# Patient Record
Sex: Male | Born: 1950 | Race: White | Hispanic: No | Marital: Married | Smoking: Never smoker
Health system: Southern US, Community
[De-identification: ages and names within clinical notes are randomized; demographics above are authoritative.]

## PROBLEM LIST (undated history)

## (undated) DIAGNOSIS — E785 Hyperlipidemia, unspecified: Secondary | ICD-10-CM

## (undated) DIAGNOSIS — G4731 Primary central sleep apnea: Secondary | ICD-10-CM

## (undated) DIAGNOSIS — J45909 Unspecified asthma, uncomplicated: Secondary | ICD-10-CM

## (undated) DIAGNOSIS — G4730 Sleep apnea, unspecified: Secondary | ICD-10-CM

## (undated) DIAGNOSIS — R112 Nausea with vomiting, unspecified: Secondary | ICD-10-CM

## (undated) DIAGNOSIS — K429 Umbilical hernia without obstruction or gangrene: Secondary | ICD-10-CM

## (undated) DIAGNOSIS — T7840XA Allergy, unspecified, initial encounter: Secondary | ICD-10-CM

## (undated) DIAGNOSIS — D126 Benign neoplasm of colon, unspecified: Secondary | ICD-10-CM

## (undated) DIAGNOSIS — K439 Ventral hernia without obstruction or gangrene: Secondary | ICD-10-CM

## (undated) DIAGNOSIS — K648 Other hemorrhoids: Secondary | ICD-10-CM

## (undated) DIAGNOSIS — Z9889 Other specified postprocedural states: Secondary | ICD-10-CM

## (undated) HISTORY — DX: Umbilical hernia without obstruction or gangrene: K42.9

## (undated) HISTORY — DX: Other hemorrhoids: K64.8

## (undated) HISTORY — DX: Other specified postprocedural states: Z98.890

## (undated) HISTORY — DX: Hyperlipidemia, unspecified: E78.5

## (undated) HISTORY — DX: Sleep apnea, unspecified: G47.30

## (undated) HISTORY — DX: Nausea with vomiting, unspecified: R11.2

## (undated) HISTORY — PX: OTHER SURGICAL HISTORY: SHX169

## (undated) HISTORY — DX: Benign neoplasm of colon, unspecified: D12.6

## (undated) HISTORY — PX: TONSILLECTOMY: SUR1361

## (undated) HISTORY — DX: Other specified postprocedural states: R11.2

## (undated) HISTORY — PX: SHOULDER SURGERY: SHX246

## (undated) HISTORY — DX: Primary central sleep apnea: G47.31

## (undated) HISTORY — PX: HERNIA REPAIR: SHX51

## (undated) HISTORY — PX: CARPAL TUNNEL RELEASE: SHX101

## (undated) HISTORY — DX: Allergy, unspecified, initial encounter: T78.40XA

## (undated) HISTORY — PX: BACK SURGERY: SHX140

## (undated) HISTORY — DX: Ventral hernia without obstruction or gangrene: K43.9

## (undated) HISTORY — PX: COLONOSCOPY: SHX174

## (undated) HISTORY — DX: Unspecified asthma, uncomplicated: J45.909

## (undated) HISTORY — PX: UMBILICAL HERNIA REPAIR: SHX196

---
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
## 1983-04-29 HISTORY — PX: FOOT SURGERY: SHX648

## 2001-07-10 ENCOUNTER — Encounter: Payer: Self-pay | Admitting: Emergency Medicine

## 2001-07-10 ENCOUNTER — Emergency Department (HOSPITAL_COMMUNITY): Admission: EM | Admit: 2001-07-10 | Discharge: 2001-07-10 | Payer: Self-pay | Admitting: Emergency Medicine

## 2003-02-02 ENCOUNTER — Ambulatory Visit (HOSPITAL_COMMUNITY): Admission: RE | Admit: 2003-02-02 | Discharge: 2003-02-02 | Payer: Self-pay | Admitting: Gastroenterology

## 2007-09-10 ENCOUNTER — Ambulatory Visit (HOSPITAL_COMMUNITY): Admission: RE | Admit: 2007-09-10 | Discharge: 2007-09-10 | Payer: Self-pay | Admitting: General Surgery

## 2008-10-11 ENCOUNTER — Inpatient Hospital Stay (HOSPITAL_COMMUNITY): Admission: RE | Admit: 2008-10-11 | Discharge: 2008-10-13 | Payer: Self-pay | Admitting: Otolaryngology

## 2008-10-11 ENCOUNTER — Encounter (INDEPENDENT_AMBULATORY_CARE_PROVIDER_SITE_OTHER): Payer: Self-pay | Admitting: Otolaryngology

## 2008-10-11 HISTORY — PX: TONSILLECTOMY: SHX5217

## 2009-05-31 ENCOUNTER — Ambulatory Visit: Admission: RE | Admit: 2009-05-31 | Discharge: 2009-05-31 | Payer: Self-pay | Admitting: Otolaryngology

## 2009-05-31 DIAGNOSIS — G4731 Primary central sleep apnea: Secondary | ICD-10-CM

## 2009-05-31 HISTORY — DX: Primary central sleep apnea: G47.31

## 2010-08-05 LAB — CBC
HCT: 43.5 % (ref 39.0–52.0)
Hemoglobin: 14.8 g/dL (ref 13.0–17.0)
MCHC: 34.1 g/dL (ref 30.0–36.0)
MCV: 87.8 fL (ref 78.0–100.0)
Platelets: 198 10*3/uL (ref 150–400)
RBC: 4.95 MIL/uL (ref 4.22–5.81)
RDW: 12.2 % (ref 11.5–15.5)
WBC: 4.3 10*3/uL (ref 4.0–10.5)

## 2010-09-10 NOTE — H&P (Signed)
Charles [name], [name]                ACCOUNT NO.:  [id number]   MEDICAL RECORD NO.:  [id number]           PATIENT TYPE:  AMB   LOCATION:  DAY                           FACILITY:  [facility]   PHYSICIAN:  Dalia Heading, M.D.  DATE OF BIRTH:  24-May-1950   DATE OF ADMISSION:  DATE OF DISCHARGE:  LH                              HISTORY & PHYSICAL   CHIEF COMPLAINT:  Hematochezia.   HISTORY OF PRESENT ILLNESS:  The patient is a 60 year old white male who  is referred for endoscopic evaluation.  He needs a colonoscopy for  hematochezia.  He has noticed some blood on the toilet paper when he  wipes himself.  No abdominal pain, weight loss, nausea, vomiting,  diarrhea, constipation, or melena had been noted.  He last had a  colonoscopy over 5 years ago in [place].  No family history of colon  carcinoma is noted.   PAST MEDICAL HISTORY:  High cholesterol levels.   PAST SURGICAL HISTORY:  Back surgery, foot surgery.   CURRENT MEDICATIONS:  Zocor, ibuprofen, fish oil, Nexium, and Prilosec  in the past.   ALLERGIES:  PENICILLIN.   REVIEW OF SYSTEMS:  Noncontributory.   PHYSICAL EXAMINATION:  GENERAL:  The patient is a well-developed and  well-nourished white male in no acute distress.  LUNGS:  Clear to auscultation with equal breath sounds bilaterally.  HEART:  Examination reveals regular rate and rhythm without S3, S4, or  murmurs.  ABDOMEN:  Soft, nontender, and nondistended.  No hepatosplenomegaly or  masses are noted.  RECTAL:  Examination was deferred to the procedure.   IMPRESSION:  Hematochezia.   PLAN:  The patient is scheduled for a colonoscopy on Aug 31, 2007.  The  risks and benefits of the procedure including bleeding and perforation  were fully explained to the patient, gave informed consent.      Dalia Heading, M.D.     MAJ/MEDQ  D:  09/02/2007  T:  09/03/2007  Job:  [id number]   cc:   Corrie Mckusick, M.D.  Fax: [phone]

## 2010-09-10 NOTE — Op Note (Signed)
Charles [name], Charles [name]                ACCOUNT NO.:  [id number]   MEDICAL RECORD NO.:  [id number]          PATIENT TYPE:  INP   LOCATION:  2602                         FACILITY:  [facility]   PHYSICIAN:  Jefry H. Pollyann Kennedy, MD     DATE OF BIRTH:  1950-06-11   DATE OF PROCEDURE:  10/11/2008  DATE OF DISCHARGE:                               OPERATIVE REPORT   PREOPERATIVE DIAGNOSES:  1. Obstructive sleep apnea syndrome, severe, respiratory distress      index 44.  2. 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  3. Bilateral inferior turbinate hyperplasia.  4. Tonsil hyperplasia.   POSTOPERATIVE DIAGNOSES:  1. Obstructive sleep apnea syndrome, severe, respiratory distress      index 44.  2. 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  3. Bilateral inferior turbinate hyperplasia.  4. Tonsil hyperplasia.   PROCEDURE:  1. Tonsillectomy.  2. Uvulopalatopharyngoplasty.  3. Revision nasal septoplasty.  4. Bilateral submucous resection, inferior turbinates.   SURGEON:  Jefry H. Pollyann Kennedy, MD   ANESTHESIA:  General endotracheal anesthesia was used.   COMPLICATIONS:  None.   BLOOD LOSS:  Minimal.   FINDINGS:  Long and thickened tonsils with a thickened, elongated, and  redundant soft palate and uvula tissue.  The right anterior nasal septal  spur was created by the anterior/inferior aspect of the quadrangular  cartilage.  Bilateral inferior turbinate hyperplasia.   REFERRING PHYSICIAN:  Corrie Mckusick, MD   HISTORY:  This is a 60 year old who was found to have moderately severe  obstructive sleep apnea syndrome and was unable to tolerate CPAP long  enough to undergo CPAP trial.  Risks, benefits, alternatives, and  complications of the procedure are explained to the patient and he seems  to understand and agreed to surgery.   PROCEDURE:  The patient was taken to the operating room and placed on  the operating table in the supine position.  Following induction of  general endotracheal anesthesia, the table was turned and the patient  was draped in a standard fashion.   1. Tonsillectomy:  A Crowe-Davis mouthgag was inserted into the oral      cavity, used to retract the tongue, and mandible attached to Mayo      stand.  The tonsils were dissected out using electrocautery      dissection, carefully dissecting the avascular plane between the      capsule and constrictor muscles.  Suction cautery was used in      multiple areas for completion of hemostasis.  2. Uvulopalatopharyngoplasty:  Initial marks were made in the anterior      palate mucosa with electrocautery allowing sufficient residual      palate to prevent postoperative VPI.  The electrocautery was used      to incise the anterior mucosa through the muscular layer and then      through the posterior mucosa slightly longer than the anterior      mucosa to allow for bringing the posterior mucosa forward during      the closure.  When the entire  pharyngeal specimen was removed, a      suction cautery was used for completion of hemostasis.  The pharynx      was irrigated with saline and re-inspected.  The corners were      tacked up initially with 3-0 Vicryl suture and then the soft palate      closure line was performed using a running 3-0 Vicryl.  The      tonsillar defect was then reapproximated with interrupted 3-0      Vicryl sutures.  Additional irrigation was used and then attention      was diverted towards the nasal part of the procedure.  3. Nasal septoplasty:  The nasal septum was injected with Xylocaine      with epinephrine.  Afrin spray had been used preoperatively.  Afrin      pledgets were placed bilaterally.  On the right side just anterior      and superior to the spur, a 2-cm mucosal incision was created.  The      Cottle elevator was used to elevate the mucosa off the      cartilaginous spur.  The Cottle elevator was then used to remove      fragments of the cartilage which  were causing the spurring.  When      there was adequate cartilage removed, there was still sufficient      dorsal support present.  The septum was quilted with 4-0 plain gut      and a Mellody Dance needle.  4. Bilateral submucous resection of inferior turbinates:  The leading      edge of the inferior turbinates was incised in a vertical fashion      following the injection of Xylocaine with epi.  Cottle elevator was      used to elevate mucosa off bone.  The bony turbinates were      elongated, but very thin and reasonably fractured into multiple      pieces.  The turbinate remnant was outfractured.  Nasal cavity and      pharynx were suctioned of blood and secretions.  The nasal cavities      were packed with rolled up Telfa coated with bacitracin.      Orogastric was used to aspirate contents of the stomach.  The      patient was then awakened, extubated, and transferred to recovery      in stable condition.      Jefry H. Pollyann Kennedy, MD  Electronically Signed     JHR/MEDQ  D:  10/11/2008  T:  10/12/2008  Job:  [id number]

## 2010-09-10 NOTE — H&P (Signed)
NAMEMACARIO, SHEAR                ACCOUNT NO.:  [id number]   MEDICAL RECORD NO.:  [id number]           PATIENT TYPE:  AMB   LOCATION:  DAY                           FACILITY:  [facility]   PHYSICIAN:  Dalia Heading, M.D.  DATE OF BIRTH:  Aug 15, 1950   DATE OF ADMISSION:  DATE OF DISCHARGE:  LH                              HISTORY & PHYSICAL   CHIEF COMPLAINT:  Hematochezia.   HISTORY OF PRESENT ILLNESS:  The patient is a 60 year old white male who  is referred for endoscopic evaluation.  He needs a colonoscopy for  hematochezia.  He has noticed some blood on the toilet paper when he  wipes himself.  No abdominal pain, weight loss, nausea, vomiting,  diarrhea, constipation, or melena had been noted.  He last had a  colonoscopy over 5 years ago in [place].  No family history of colon  carcinoma is noted.   PAST MEDICAL HISTORY:  High cholesterol levels.   PAST SURGICAL HISTORY:  Back surgery, foot surgery.   CURRENT MEDICATIONS:  Zocor, ibuprofen, fish oil, Nexium, and Prilosec  in the past.   ALLERGIES:  PENICILLIN.   REVIEW OF SYSTEMS:  Noncontributory.   PHYSICAL EXAMINATION:  GENERAL:  The patient is a well-developed and  well-nourished white male in no acute distress.  LUNGS:  Clear to auscultation with equal breath sounds bilaterally.  HEART:  Examination reveals regular rate and rhythm without S3, S4, or  murmurs.  ABDOMEN:  Soft, nontender, and nondistended.  No hepatosplenomegaly or  masses are noted.  RECTAL:  Examination was deferred to the procedure.   IMPRESSION:  Hematochezia.   PLAN:  The patient is scheduled for a colonoscopy on Aug 31, 2007.  The  risks and benefits of the procedure including bleeding and perforation  were fully explained to the patient, gave informed consent.      Dalia Heading, M.D.  Electronically Signed     MAJ/MEDQ  D:  09/02/2007  T:  09/03/2007  Job:  [id number]   cc:   Corrie Mckusick, M.D.  Fax: [phone]

## 2010-09-13 NOTE — Op Note (Signed)
   NAME:  FLEET, HIGHAM                          ACCOUNT NO.:  [id number]   MEDICAL RECORD NO.:  [id number]                   PATIENT TYPE:  AMB   LOCATION:  ENDO                                 FACILITY:  [facility]   PHYSICIAN:  Danise Edge, M.D.                DATE OF BIRTH:  June 20, 1950   DATE OF PROCEDURE:  02/02/2003  DATE OF DISCHARGE:                                 OPERATIVE REPORT   PROCEDURE PERFORMED:  Screening colonoscopy.   ENDOSCOPIST:  Charolett Bumpers, M.D.   INDICATIONS FOR PROCEDURE:  The patient is a 60 year old male born [birth year].  Mr. Degrace is scheduled to undergo his first screening colonoscopy  with polypectomy to prevent colon cancer.  There are no first degree family  members with colon cancer.  There are second degree family members with  colon cancer.   PREMEDICATION:  Versed 10 mg, Demerol 75 mg.   DESCRIPTION OF PROCEDURE:  After obtaining informed consent, Mr. Cosman was  placed in the left lateral decubitus position.  I administered intravenous  Demerol and intravenous Versed to achieve conscious sedation for the  procedure.  The patient's blood pressure, oxygen saturations and cardiac  rhythm were monitored throughout the procedure and documented in the medical  record.   Anal inspection was normal.  Digital rectal exam was normal.  The prostate  was small and nonnodular.  The pediatric Olympus adjustable colonoscope was  introduced into the rectum and advanced to the cecum.  A normal-appearing  ileocecal valve was intubated and the distal ileum inspected.  Colonic  preparation for the exam today was excellent.   Rectum:  Normal.   Sigmoid colon and descending colon:  Normal.   Splenic flexure:  Normal.   Transverse colon:  Normal.   Hepatic flexure:  Normal.   Ascending colon:  Normal.   Cecum and ileocecal valve:  Normal.   Distal ileum:  Normal.    ASSESSMENT:  Normal screening proctocolonoscopy to the cecum.   RECOMMENDATIONS:  Repeat optical colonoscopy or virtual colonoscopy in five  to 10 years.                                               Danise Edge, M.D.    MJ/MEDQ  D:  02/02/2003  T:  02/02/2003  Job:  [id number]   cc:   Ike Bene, M.D.  [address]  [place]  [place] [address]  Fax: [phone]

## 2011-05-19 ENCOUNTER — Other Ambulatory Visit (HOSPITAL_COMMUNITY): Payer: Self-pay | Admitting: Family Medicine

## 2011-05-19 DIAGNOSIS — M545 Low back pain: Secondary | ICD-10-CM

## 2011-05-22 ENCOUNTER — Ambulatory Visit (HOSPITAL_COMMUNITY)
Admission: RE | Admit: 2011-05-22 | Discharge: 2011-05-22 | Disposition: A | Discharge status: Home / Self Care | Payer: BC Managed Care – PPO | Source: Ambulatory Visit | Attending: Family Medicine | Admitting: Family Medicine

## 2011-05-22 DIAGNOSIS — IMO0002 Reserved for concepts with insufficient information to code with codable children: Secondary | ICD-10-CM | POA: Insufficient documentation

## 2011-05-22 DIAGNOSIS — M545 Low back pain, unspecified: Secondary | ICD-10-CM | POA: Insufficient documentation

## 2011-05-22 DIAGNOSIS — M25559 Pain in unspecified hip: Secondary | ICD-10-CM | POA: Insufficient documentation

## 2011-05-22 DIAGNOSIS — M79609 Pain in unspecified limb: Secondary | ICD-10-CM | POA: Insufficient documentation

## 2011-05-22 DIAGNOSIS — M5124 Other intervertebral disc displacement, thoracic region: Secondary | ICD-10-CM | POA: Insufficient documentation

## 2011-09-08 HISTORY — PX: DOPPLER ECHOCARDIOGRAPHY: SHX263

## 2012-11-30 ENCOUNTER — Other Ambulatory Visit: Payer: Self-pay | Admitting: Cardiovascular Disease

## 2012-11-30 DIAGNOSIS — K429 Umbilical hernia without obstruction or gangrene: Secondary | ICD-10-CM

## 2012-11-30 DIAGNOSIS — K439 Ventral hernia without obstruction or gangrene: Secondary | ICD-10-CM

## 2012-11-30 HISTORY — DX: Ventral hernia without obstruction or gangrene: K43.9

## 2012-11-30 HISTORY — DX: Umbilical hernia without obstruction or gangrene: K42.9

## 2012-11-30 LAB — COMPREHENSIVE METABOLIC PANEL
ALT: 25 U/L (ref 0–53)
AST: 28 U/L (ref 0–37)
Albumin: 4.4 g/dL (ref 3.5–5.2)
Alkaline Phosphatase: 30 U/L — ABNORMAL LOW (ref 39–117)
BUN: 18 mg/dL (ref 6–23)
CO2: 27 mEq/L (ref 19–32)
Calcium: 9.4 mg/dL (ref 8.4–10.5)
Chloride: 103 mEq/L (ref 96–112)
Creat: 1.01 mg/dL (ref 0.50–1.35)
Glucose, Bld: 91 mg/dL (ref 70–99)
Potassium: 4.4 mEq/L (ref 3.5–5.3)
Sodium: 138 mEq/L (ref 135–145)
Total Bilirubin: 0.8 mg/dL (ref 0.3–1.2)
Total Protein: 6.8 g/dL (ref 6.0–8.3)

## 2012-11-30 LAB — LIPID PANEL
HDL: 36 mg/dL — ABNORMAL LOW (ref >39)
LDL Cholesterol: 72 mg/dL (ref 0–99)
Triglycerides: 192 mg/dL — ABNORMAL HIGH (ref <150)
VLDL: 38 mg/dL (ref 0–40)

## 2012-11-30 LAB — CBC WITH DIFFERENTIAL/PLATELET
Basophils Absolute: 0 10*3/uL (ref 0.0–0.1)
Basophils Relative: 0 % (ref 0–1)
Eosinophils Absolute: 0.1 10*3/uL (ref 0.0–0.7)
Eosinophils Relative: 2 % (ref 0–5)
HCT: 44.6 % (ref 39.0–52.0)
Hemoglobin: 15.4 g/dL (ref 13.0–17.0)
Lymphocytes Relative: 30 % (ref 12–46)
Lymphs Abs: 1.5 10*3/uL (ref 0.7–4.0)
MCH: 29.2 pg (ref 26.0–34.0)
MCHC: 34.5 g/dL (ref 30.0–36.0)
MCV: 84.6 fL (ref 78.0–100.0)
Monocytes Absolute: 0.4 10*3/uL (ref 0.1–1.0)
Monocytes Relative: 9 % (ref 3–12)
Neutro Abs: 3.1 10*3/uL (ref 1.7–7.7)
Neutrophils Relative %: 59 % (ref 43–77)
Platelets: 225 10*3/uL (ref 150–400)
RBC: 5.27 MIL/uL (ref 4.22–5.81)
RDW: 13 % (ref 11.5–15.5)
WBC: 5.2 10*3/uL (ref 4.0–10.5)

## 2012-12-02 ENCOUNTER — Encounter: Payer: Self-pay | Admitting: Cardiovascular Disease

## 2013-06-14 ENCOUNTER — Ambulatory Visit (HOSPITAL_BASED_OUTPATIENT_CLINIC_OR_DEPARTMENT_OTHER): Admit: 2013-06-14 | Payer: Self-pay | Admitting: Orthopaedic Surgery

## 2013-06-14 ENCOUNTER — Encounter (HOSPITAL_BASED_OUTPATIENT_CLINIC_OR_DEPARTMENT_OTHER): Payer: Self-pay

## 2013-06-14 SURGERY — CARPAL TUNNEL RELEASE
Anesthesia: Choice | Site: Wrist | Laterality: Right

## 2013-06-30 ENCOUNTER — Ambulatory Visit: Payer: BC Managed Care – PPO | Admitting: Cardiology

## 2013-07-19 ENCOUNTER — Encounter: Payer: Self-pay | Admitting: *Deleted

## 2013-07-21 ENCOUNTER — Ambulatory Visit (INDEPENDENT_AMBULATORY_CARE_PROVIDER_SITE_OTHER): Payer: BC Managed Care – PPO | Admitting: Cardiology

## 2013-07-21 ENCOUNTER — Encounter: Payer: Self-pay | Admitting: Cardiology

## 2013-07-21 VITALS — BP 130/68 | HR 71 | Ht 68.0 in | Wt 215.1 lb

## 2013-07-21 DIAGNOSIS — Z79899 Other long term (current) drug therapy: Secondary | ICD-10-CM

## 2013-07-21 DIAGNOSIS — I4949 Other premature depolarization: Secondary | ICD-10-CM

## 2013-07-21 DIAGNOSIS — E785 Hyperlipidemia, unspecified: Secondary | ICD-10-CM

## 2013-07-21 DIAGNOSIS — I493 Ventricular premature depolarization: Secondary | ICD-10-CM | POA: Insufficient documentation

## 2013-07-21 LAB — COMPREHENSIVE METABOLIC PANEL
ALBUMIN: 4.6 g/dL (ref 3.5–5.2)
ALT: 22 U/L (ref 0–53)
AST: 23 U/L (ref 0–37)
Alkaline Phosphatase: 30 U/L — ABNORMAL LOW (ref 39–117)
BUN: 19 mg/dL (ref 6–23)
CALCIUM: 9.8 mg/dL (ref 8.4–10.5)
CHLORIDE: 104 meq/L (ref 96–112)
CO2: 24 mEq/L (ref 19–32)
Creat: 1.06 mg/dL (ref 0.50–1.35)
Glucose, Bld: 86 mg/dL (ref 70–99)
POTASSIUM: 4.6 meq/L (ref 3.5–5.3)
Sodium: 138 mEq/L (ref 135–145)
Total Bilirubin: 0.7 mg/dL (ref 0.2–1.2)
Total Protein: 7.2 g/dL (ref 6.0–8.3)

## 2013-07-21 NOTE — Patient Instructions (Signed)
Your physician has requested that you have en exercise stress myoview. For further information please visit [email or web address]. Please follow instruction sheet, as given.  Labs NMR WITH LIPIDS,CMP  Your physician wants you to follow-up in 6 MONTHS  Dr Ellyn Hack. You will receive a reminder letter in the mail two months in advance. If you don't receive a letter, please call our office to schedule the follow-up appointment.

## 2013-07-21 NOTE — Progress Notes (Signed)
PATIENT: Charles [name] MRN: [id number] DOB: 1950/06/25 PCP: Purvis Kilts, MD  Clinic Note: Chief Complaint  Patient presents with  . 6 MONTH VISIT    NO CHEST PAIN VERY SELDOM  SOB , NO EDEMA    HPI: Charles [name] is a 63 y.o. male with a PMH below who presents today for establishing new cardiology care after the return of Dr. Rollene Fare. He has been followed for preventative cardiology with significant family history including his father who died of an MI after troubles for several years. He was 67. His brother at 85 also had coronary disease status post CABG. His father and brother are now dead.  Interval History: He presents today doing okay. No significant chest tightness or pressure. He has noticed increasing frequency of irregular heartbeats. They can come and go regardless of what he is doing. One thing he is also noticing that is generally more fatigued in the past by the afternoon he is just pretty much worn out. He also has been having significant pressure headaches. He denies any heart failure symptoms of PND, orthopnea or edema. Denies any claudication. No melena, hematochezia or hematuria. No CP/near syncope. No TIA or amaurosis fugax symptoms.  Past Medical History  Diagnosis Date  . Hx of sleep apnea     had used C-PAP,HE HAD ent  surgery 2008 by Dr Constance Holster and no longer requires C-PAP  . Ventral hernia 11/30/2012    ASYMPTOMATIC  . Umbilical hernia 05/30/1939    ASYMPTOMATIC WITH NO GI  SYMPTOMS  . Hyperlipidemia   . Sleep apnea, central 05/31/2009    NOCTURNAL POLYSOMNOGRAM -(DR Constance Holster, ENT) MILD SLEEP APNEA SYNDROME ,MILD HYPOVENTILATION ; ABN SLEEP ARCHITECTURE WITH POOR SLEEP EFFICIENCY AND REDUCED SLOW WAVE SLEEP RESULTS AN INCREASESD STAGE 2 SLEEP. ;; REC 2 L QHS O2 - No longer on CPAP     Prior Cardiac Evaluation and Past Surgical History: Past Surgical History  Procedure Laterality Date  . Doppler echocardiography  09/08/2011    EF =>74% ,LV SYSTOLIC FUNCTION  NORMAL   . Treadmill exercise stress test  01/01/2012    NORMOTENSIVE REPONSE TO EXERCISE; GOOD EXERCISE TOLERANCE;NO ECG evidence of exercise-induced ischemia; rare PVCs  . Nm myocar single w/spect  07/30/2007    EF 08% ,lv systolic function  is normal   . Lumbar laminectomy  1970  . Nasal septum surgery  1970  . Foot surgery Right 1985  . Tonsillectomy  10/11/2008    DR Constance Holster-- with uvulopalatophrayngoplasty,revision nasal septolasty    Allergies  Allergen Reactions  . Penicillins     Current Outpatient Prescriptions  Medication Sig Dispense Refill  . Ascorbic Acid (VITAMIN C PO) Take 1,000 mg by mouth.      Marland Kitchen aspirin EC 81 MG tablet Take 81 mg by mouth daily.      . Calcium Carbonate-Vit D-Min (CALCIUM 1200 PO) Take by mouth.      . Cholecalciferol (VITAMIN D3) 400 UNITS CAPS Take by mouth.      . Coenzyme Q10 (CO Q 10 PO) Take 30 mg by mouth daily.      . Cyanocobalamin (VITAMIN B 12 PO) Take 1,000 mg by mouth daily.      . fenofibrate 160 MG tablet Take 160 mg by mouth daily.      . folic acid (FOLVITE) 1 MG tablet Take 1 mg by mouth daily.      Marland Kitchen GARLIC PO Take by mouth.      . Glucosamine  HCl (GLUCOSAMINE PO) Take by mouth.      Marland Kitchen ibuprofen (ADVIL,MOTRIN) 200 MG tablet Take 200 mg by mouth every 6 (six) hours as needed.      . Magnesium 200 MG TABS Take by mouth.      . Multiple Vitamin (MULTIVITAMIN) tablet Take 1 tablet by mouth daily.      . Omega-3 Fatty Acids (FISH OIL PO) Take by mouth. Take 2 caps in morning and 1 caps in evening      . simvastatin (ZOCOR) 20 MG tablet Take 20 mg by mouth daily.      . Zinc 50 MG TABS Take by mouth daily.       No current facility-administered medications for this visit.    History   Social History Narrative   Married father of 2 with 1 grandchild. 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   He does not do any regular walking.  He is to use his home workout machine, but now is noted that he really hasn't had the same amount of time at the end of the day the use to. Using working from 6 AM to 7 PM.    Family History: Alzheimer's disease in his father; Diabetes in his brother; Heart disease in his brother - died age 97 from MI, status post CABG; Heart disease (age of onset: 5) in his father; Hyperlipidemia in his mother.  ROS: A comprehensive Review of Systems - Negative except Symptoms noted in of present illness.  PHYSICAL EXAM BP 130/68  Pulse 71  Ht 5\' 8"  (1.727 m)  Wt 215 lb 1.6 oz (97.569 kg)  BMI 32.71 kg/m2 General appearance: alert, cooperative, appears stated age, no distress and mildly obese Neck: no adenopathy, no carotid bruit, no JVD and supple, symmetrical, trachea midline Lungs: clear to auscultation bilaterally, normal percussion bilaterally and Nonlabored, good air movement Heart: RRR, normal S1 and S2. Soft 1/6 SEM at LUSB. No other significant M./R./G.; Significant ectopy Abdomen: soft, non-tender; bowel sounds normal; no masses,  no organomegaly Extremities: extremities normal, atraumatic, no cyanosis or edema Pulses: 2+ and symmetric Neurologic: Alert and oriented X 3, normal strength and tone. Normal symmetric reflexes. Normal coordination and gait   Adult ECG Report  Rate: 77 ;  Rhythm: normal sinus rhythm and premature ventricular contractions (PVC)  QRS Axis: -1 ;  PR Interval: 144 ;  QRS Duration: 88 ; QTc: 434;   Voltages: Normal  Conduction Disturbances: none  Other Abnormalities: none   Narrative Interpretation: Sinus rhythm with frequent PVCs in almost a trigeminy pattern  Recent Labs: 07/22/2011: S. right 164 and LDL 66. (In August 2014: Toprol from 146, HDL 36 and LDL 72.TG was 192.  ASSESSMENT / PLAN: Overall relatively stable for symptom standpoint however he does have significant amount of PVCs on  EKG, also noted on exam.  He is on aspirin for preventative nature. For his lipids he is on simvastatin as well as CoQ10 and omega-3 fish 00. This is in conjunction with the CoQ10. Although symptoms seem to be getting better, I am concerned that his PVCs are still there at present.    Frequent PVCs Monomorphic PVCs on EKG are somewhat concerning potential ischemia. At this point I am inclined to evaluate  his change in symptoms with an exercise Myoview. I am not sure he'll be able to do the medics as necessary for a straight a treadmill exercise test. My main concern is that he may not be able to complete exercises treadmill test based on his concerns.  Dyslipidemia, goal LDL below 100 - on statin & fibrate His lipids are well-controlled pleased with a goal. He is taking multiple medications. - LAD is being followed by PCP   2 Orders Placed This Encounter  Procedures  . NMR Lipoprofile with Lipids  . Comprehensive metabolic panel  . Myocardial Perfusion Imaging    Standing Status: Future     Number of Occurrences:      Standing Expiration Date: 07/21/2014    Order Specific Question:  Where should this test be performed    Answer:  [facility]    Order Specific Question:  Type of stress    Answer:  Exercise    Order Specific Question:  Patient weight in lbs    Answer:  215  . EKG 12-Lead   Meds ordered this encounter  Medications  . ibuprofen (ADVIL,MOTRIN) 200 MG tablet    Sig: Take 200 mg by mouth every 6 (six) hours as needed.  . Magnesium 200 MG TABS    Sig: Take by mouth.  . Zinc 50 MG TABS    Sig: Take by mouth daily.  . Cyanocobalamin (VITAMIN B 12 PO)    Sig: Take 1,000 mg by mouth daily.  . Coenzyme Q10 (CO Q 10 PO)    Sig: Take 30 mg by mouth daily.    Followup: 6 months  Solace Manwarren W. Ellyn Hack, M.D., M.S. Interventional Cardiolgy [facility]

## 2013-07-22 LAB — NMR LIPOPROFILE WITH LIPIDS
CHOLESTEROL, TOTAL: 140 mg/dL (ref <200)
HDL PARTICLE NUMBER: 36 umol/L (ref >=30.5)
HDL Size: 8.2 nm — ABNORMAL LOW (ref >=9.2)
HDL-C: 41 mg/dL (ref >=40)
LDL (calc): 66 mg/dL (ref <100)
LDL Particle Number: 925 nmol/L (ref <1000)
LDL Size: 20.5 nm — ABNORMAL LOW (ref >20.5)
LP-IR Score: 59 — ABNORMAL HIGH (ref <=45)
Large HDL-P: <1.3 umol/L — ABNORMAL LOW (ref >=4.8)
Large VLDL-P: 1.6 nmol/L (ref <=2.7)
Small LDL Particle Number: 409 nmol/L (ref <=527)
TRIGLYCERIDES: 164 mg/dL — AB (ref <150)
VLDL SIZE: 43.2 nm (ref <=46.6)

## 2013-07-23 ENCOUNTER — Encounter: Payer: Self-pay | Admitting: Cardiology

## 2013-07-23 NOTE — Assessment & Plan Note (Signed)
Monomorphic PVCs on EKG are somewhat concerning potential ischemia. At this point I am inclined to evaluate his change in symptoms with an exercise Myoview. I am not sure he'll be able to do the medics as necessary for a straight a treadmill exercise test. My main concern is that he may not be able to complete exercises treadmill test based on his concerns.

## 2013-07-23 NOTE — Assessment & Plan Note (Signed)
His lipids are well-controlled pleased with a goal. He is taking multiple medications. - LAD is being followed by PCP

## 2013-07-28 ENCOUNTER — Telehealth (HOSPITAL_COMMUNITY): Payer: Self-pay

## 2013-07-29 ENCOUNTER — Telehealth (HOSPITAL_COMMUNITY): Payer: Self-pay

## 2013-08-03 ENCOUNTER — Encounter (HOSPITAL_COMMUNITY): Payer: BC Managed Care – PPO

## 2013-08-04 ENCOUNTER — Telehealth (HOSPITAL_COMMUNITY): Payer: Self-pay | Admitting: *Deleted

## 2013-08-11 ENCOUNTER — Ambulatory Visit (HOSPITAL_COMMUNITY)
Admission: RE | Admit: 2013-08-11 | Discharge: 2013-08-11 | Disposition: A | Discharge status: Home / Self Care | Payer: BC Managed Care – PPO | Source: Ambulatory Visit | Attending: Cardiology | Admitting: Cardiology

## 2013-08-11 DIAGNOSIS — R0609 Other forms of dyspnea: Secondary | ICD-10-CM | POA: Insufficient documentation

## 2013-08-11 DIAGNOSIS — I4949 Other premature depolarization: Secondary | ICD-10-CM

## 2013-08-11 DIAGNOSIS — R002 Palpitations: Secondary | ICD-10-CM | POA: Insufficient documentation

## 2013-08-11 DIAGNOSIS — R0989 Other specified symptoms and signs involving the circulatory and respiratory systems: Secondary | ICD-10-CM | POA: Insufficient documentation

## 2013-08-11 DIAGNOSIS — R42 Dizziness and giddiness: Secondary | ICD-10-CM | POA: Insufficient documentation

## 2013-08-11 DIAGNOSIS — R5383 Other fatigue: Secondary | ICD-10-CM

## 2013-08-11 DIAGNOSIS — R5381 Other malaise: Secondary | ICD-10-CM | POA: Insufficient documentation

## 2013-08-11 DIAGNOSIS — I493 Ventricular premature depolarization: Secondary | ICD-10-CM

## 2013-08-11 MED ORDER — TECHNETIUM TC 99M SESTAMIBI GENERIC - CARDIOLITE
30.6000 | Freq: Once | INTRAVENOUS | Status: AC | PRN
  Administered 2013-08-11: 31 via INTRAVENOUS

## 2013-08-11 MED ORDER — TECHNETIUM TC 99M SESTAMIBI GENERIC - CARDIOLITE
10.5000 | Freq: Once | INTRAVENOUS | Status: AC | PRN
  Administered 2013-08-11: 11 via INTRAVENOUS

## 2013-08-11 NOTE — Procedures (Addendum)
[facility] [address] [place] [address] [phone]  Cardiology Nuclear Med Study  Charles [name] is a 63 y.o. male     MRN : [id number]     DOB: 29-Aug-1950  Procedure Date: 08/11/2013  Nuclear Med Background Indication for Stress Test:  Evaluation for Ischemia History:  No prior respiratory or cardiac history reported;No prior NUC study for comparrison;ECHO on 08/2011-EF=55% Cardiac Risk Factors: Family History - CAD, Lipids and Obesity  Symptoms:  Dizziness, DOE, Fatigue, Light-Headedness and Palpitations   Nuclear Pre-Procedure Caffeine/Decaff Intake:  7:00pm NPO After: 5:00am   IV Site: R Forearm  IV 0.9% NS with Angio Cath:  22g  Chest Size (in):  44"  IV Started by: Rolene Course, RN  Height: 5\' 8"  (1.727 m)  Cup Size: n/a  BMI:  Body mass index is 32.7 kg/(m^2). Weight:  215 lb (97.523 kg)   Tech Comments:  n/a    Nuclear Med Study 1 or 2 day study: 1 day  Stress Test Type:  Stress  Order Authorizing Provider:  Glenetta Hew, MD   Resting Radionuclide: Technetium 92m Sestamibi  Resting Radionuclide Dose: 10.5 mCi   Stress Radionuclide:  Technetium 36m Sestamibi  Stress Radionuclide Dose: 30.6 mCi           Stress Protocol Rest HR: 77 Stress HR: 148  Rest BP: 130/74 Stress BP: 166/74  Exercise Time (min): 11:01 METS: 12.10          Dose of Adenosine (mg):  n/a Dose of Lexiscan: n/a mg  Dose of Atropine (mg): n/a Dose of Dobutamine: n/a mcg/kg/min (at max HR)  Stress Test Technologist: Mellody Memos, CCT Nuclear Technologist: Imagene Riches, CNMT   Rest Procedure:  Myocardial perfusion imaging was performed at rest 45 minutes following the intravenous administration of Technetium 66m Sestamibi. Stress Procedure:  The patient performed treadmill exercise using a Bruce  Protocol for 11 minutes and 1 second. The patient stopped due to shortness of breath and leg fatigue. Patient denied any chest pain.   There were no significant ST-T wave changes.  Technetium 71m Sestamibi was injected IV at peak exercise and myocardial perfusion imaging was performed after a brief delay.  Transient Ischemic Dilatation (Normal <1.22):  0.86 Lung/Heart Ratio (Normal <0.45):  0.41 QGS EDV:  67 ml QGS ESV:  25 ml LV Ejection Fraction: 62%      Rest ECG: NSR - Normal EKG  Stress ECG: No significant change from baseline ECG  QPS Raw Data Images:  Normal; no motion artifact; normal heart/lung ratio. Stress Images:  Normal homogeneous uptake in all areas of the myocardium. Rest Images:  Normal homogeneous uptake in all areas of the myocardium. Subtraction (SDS):  Normal  Impression Exercise Capacity:  Excellent exercise capacity. BP Response:  Normal blood pressure response. Clinical Symptoms:  No significant symptoms noted. ECG Impression:  No significant ST segment change suggestive of ischemia. Comparison with Prior Nuclear Study: No previous nuclear study performed  Overall Impression:  Normal stress nuclear study.  LV Wall Motion:  NL LV Function, EF 62%; NL Wall Motion   Troy Sine, MD  08/11/2013 4:51 PM

## 2013-08-14 NOTE — Progress Notes (Signed)
Quick Note:  Stress Test looked good!! No sign of significant Heart Artery Disease. Pump function is normal. Excellent result -- great effort.  Good news!!.  Leonie Man, MD  ______

## 2013-08-16 ENCOUNTER — Telehealth: Payer: Self-pay | Admitting: *Deleted

## 2013-08-16 NOTE — Telephone Encounter (Signed)
Spoke to patient. Stress test result given . Verbalized understanding

## 2013-08-16 NOTE — Telephone Encounter (Signed)
Message copied by Raiford Simmonds on Tue Aug 16, 2013  9:32 AM ------      Message from: Leonie Man      Created: Sun Aug 14, 2013  3:51 PM       Stress Test looked good!! No sign of significant Heart Artery Disease.  Pump function is normal.      Excellent result -- great effort.            Good news!!.            Leonie Man, MD       ------

## 2013-10-26 NOTE — Telephone Encounter (Signed)
Encounter complete. 

## 2013-11-09 NOTE — Telephone Encounter (Signed)
Encounter complete. 

## 2014-06-16 ENCOUNTER — Other Ambulatory Visit: Payer: Self-pay | Admitting: Family Medicine

## 2014-06-16 DIAGNOSIS — M25511 Pain in right shoulder: Secondary | ICD-10-CM

## 2014-06-16 DIAGNOSIS — M25512 Pain in left shoulder: Principal | ICD-10-CM

## 2014-07-04 ENCOUNTER — Ambulatory Visit
Admission: RE | Admit: 2014-07-04 | Discharge: 2014-07-04 | Disposition: A | Discharge status: Home / Self Care | Payer: BLUE CROSS/BLUE SHIELD | Source: Ambulatory Visit | Attending: Family Medicine | Admitting: Family Medicine

## 2014-07-04 DIAGNOSIS — M25511 Pain in right shoulder: Secondary | ICD-10-CM

## 2014-07-04 DIAGNOSIS — M25512 Pain in left shoulder: Principal | ICD-10-CM

## 2014-08-14 ENCOUNTER — Encounter: Payer: Self-pay | Admitting: Cardiology

## 2014-08-14 ENCOUNTER — Ambulatory Visit (INDEPENDENT_AMBULATORY_CARE_PROVIDER_SITE_OTHER): Payer: BLUE CROSS/BLUE SHIELD | Admitting: Cardiology

## 2014-08-14 VITALS — BP 122/74 | HR 86 | Ht 69.0 in | Wt 213.6 lb

## 2014-08-14 DIAGNOSIS — Z79899 Other long term (current) drug therapy: Secondary | ICD-10-CM

## 2014-08-14 DIAGNOSIS — I493 Ventricular premature depolarization: Secondary | ICD-10-CM | POA: Diagnosis not present

## 2014-08-14 DIAGNOSIS — E669 Obesity, unspecified: Secondary | ICD-10-CM | POA: Diagnosis not present

## 2014-08-14 DIAGNOSIS — E785 Hyperlipidemia, unspecified: Secondary | ICD-10-CM | POA: Diagnosis not present

## 2014-08-14 LAB — LIPID PANEL
Cholesterol: 174 mg/dL (ref 0–200)
HDL: 39 mg/dL — AB (ref >=40)
LDL CALC: 93 mg/dL (ref 0–99)
Total CHOL/HDL Ratio: 4.5 Ratio
Triglycerides: 209 mg/dL — ABNORMAL HIGH (ref <150)
VLDL: 42 mg/dL — AB (ref 0–40)

## 2014-08-14 LAB — COMPREHENSIVE METABOLIC PANEL
ALK PHOS: 52 U/L (ref 39–117)
ALT: 21 U/L (ref 0–53)
AST: 23 U/L (ref 0–37)
Albumin: 4.4 g/dL (ref 3.5–5.2)
BILIRUBIN TOTAL: 0.6 mg/dL (ref 0.2–1.2)
BUN: 20 mg/dL (ref 6–23)
CO2: 27 meq/L (ref 19–32)
CREATININE: 0.95 mg/dL (ref 0.50–1.35)
Calcium: 9.8 mg/dL (ref 8.4–10.5)
Chloride: 103 mEq/L (ref 96–112)
GLUCOSE: 79 mg/dL (ref 70–99)
Potassium: 4.7 mEq/L (ref 3.5–5.3)
SODIUM: 139 meq/L (ref 135–145)
Total Protein: 7.3 g/dL (ref 6.0–8.3)

## 2014-08-14 NOTE — Patient Instructions (Signed)
LABS -CMP ,LIPID TODAY  NO CHANGE WITH MEDICATIONS.  Your physician wants you to follow-up in [place].  You will receive a reminder letter in the mail two months in advance. If you don't receive a letter, please call our office to schedule the follow-up appointment.

## 2014-08-14 NOTE — Assessment & Plan Note (Signed)
The patient understands the need to lose weight with diet and exercise. We have discussed specific strategies for this.  

## 2014-08-14 NOTE — Assessment & Plan Note (Signed)
Monomorphic.   Asymptomatic - no need to treat.

## 2014-08-14 NOTE — Assessment & Plan Note (Signed)
Previously well controlled.   Check labs today - CMP, Lipids

## 2014-08-14 NOTE — Progress Notes (Signed)
PCP: Purvis Kilts, MD  Clinic Note: Chief Complaint  Patient presents with  . Annual Exam    no chest pain ir tightness or leg pain or swelling  . Hyperlipidemia    HPI: Charles [name] is a 64 y.o. male with a PMH below who presents today for f/u of Cardiac Risk Factors & Family History of CAD.  I last saw him as a new patient after the retirement of Dr. Rollene Fare.  At that visit, he complained of PVCs. He has been followed for preventative cardiology with significant family history including his father who died of an MI after troubles for several years. He was 67. His brother at 59 also had coronary disease status post CABG. His father and brother are now dead.  Sep 05, 2013 - Myoview,non-ischemic, EF 62%  Past Medical History  Diagnosis Date  . Ventral hernia 11/30/2012    ASYMPTOMATIC  . Umbilical hernia 06/28/2949    ASYMPTOMATIC WITH NO GI  SYMPTOMS  . Hyperlipidemia     well controlled  . Sleep apnea, central 05/31/2009    NOCTURNAL POLYSOMNOGRAM -(DR Constance Holster, ENT) MILD SLEEP APNEA SYNDROME ,MILD HYPOVENTILATION ; ABN SLEEP ARCHITECTURE WITH POOR SLEEP EFFICIENCY AND REDUCED SLOW WAVE SLEEP RESULTS AN INCREASESD STAGE 2 SLEEP. ;; REC 2 L QHS O2 - No longer on CPAP - Had ENT surgery in 2008    Prior Cardiac Evaluation and Past Surgical History: Past Surgical History  Procedure Laterality Date  . Doppler echocardiography  09/08/2011    EF =>88% ,LV SYSTOLIC FUNCTION NORMAL   . Treadmill exercise stress test  01/01/2012; 2013/09/05    a) 9/'13:NORMOTENSIVE REPONSE TO EXERCISE; GOOD EXERCISE TOLERANCE;NO ECG evidence of exercise-induced ischemia; rare PVCs;; b) 4/'15: Non-ischemic, EF 62%  . Lumbar laminectomy  1970  . Nasal septum surgery  1970  . Foot surgery Right 1985  . Tonsillectomy  10/11/2008    DR Constance Holster-- with uvulopalatophrayngoplasty,revision nasal septolasty    Interval History: Doing well from a cardiac standpoint.  Palpitations are less prominent - not noticing  them.  No CP or dyspnea with rest or exertion.  Has not been as active due to shoulder pain. Cardiovascular ROS: no chest pain or dyspnea on exertion negative for - edema, irregular heartbeat, loss of consciousness, orthopnea, palpitations, paroxysmal nocturnal dyspnea, rapid heart rate, shortness of breath or TIA/amaurosis fugax, syncope/near syncope  Walks some - but just doesn't feel like it.  Can't do his [employer] anymore.  ROS: A comprehensive was performed.  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 Latanya Maudlin) Review of Systems  Constitutional: Positive for malaise/fatigue (due to narcotic use for shoulders).  Cardiovascular: Negative for claudication.  Gastrointestinal: Negative for constipation (uses lots of fiber), blood in stool and melena.  Genitourinary: Negative for hematuria.  Musculoskeletal: Positive for joint pain (bilateral shoulder pain - oA & bone spurs) - pending surgery) and neck pain. Negative for myalgias.       Loss of Energy & feeling out of it 2/2 taking Oxycodone for pain.  Neurological: Positive for dizziness (Vertigo). Negative for headaches.  Psychiatric/Behavioral: The patient is nervous/anxious (due to upcomine surgery).   All other systems reviewed and are negative.   Current Outpatient Prescriptions on File Prior to Visit  Medication Sig Dispense Refill  . Ascorbic Acid (VITAMIN C PO) Take 1,000 mg by mouth.    Marland Kitchen aspirin EC 81 MG tablet Take 81 mg by mouth daily.    . Calcium Carbonate-Vit D-Min (CALCIUM 1200  PO) Take by mouth.    . Cholecalciferol (VITAMIN D3) 400 UNITS CAPS Take by mouth.    . Coenzyme Q10 (CO Q 10 PO) Take 30 mg by mouth daily.    . Cyanocobalamin (VITAMIN B 12 PO) Take 1,000 mg by mouth daily.    . fenofibrate 160 MG tablet Take 160 mg by mouth daily.    . folic acid (FOLVITE) 1 MG tablet Take 1 mg by mouth daily.    Marland Kitchen GARLIC PO Take by mouth.    . Glucosamine HCl (GLUCOSAMINE PO) Take by mouth.    Marland Kitchen ibuprofen (ADVIL,MOTRIN)  200 MG tablet Take 200 mg by mouth every 6 (six) hours as needed.    . Magnesium 200 MG TABS Take by mouth.    . Multiple Vitamin (MULTIVITAMIN) tablet Take 1 tablet by mouth daily.    . Omega-3 Fatty Acids (FISH OIL PO) Take by mouth. Take 2 caps in morning and 1 caps in evening    . simvastatin (ZOCOR) 20 MG tablet Take 20 mg by mouth daily.    . Zinc 50 MG TABS Take by mouth daily.     No current facility-administered medications on file prior to visit.   Allergies  Allergen Reactions  . Penicillins      History  Substance Use Topics  . Smoking status: Never Smoker   . Smokeless tobacco: Not on file  . Alcohol Use: Yes   Family History  Problem Relation Age of Onset  . Hyperlipidemia Mother   . Heart disease Father 38  . Alzheimer's disease Father   . Heart disease Brother   . Diabetes Brother     Wt Readings from Last 3 Encounters:  08/14/14 213 lb 9.6 oz (96.888 kg)  08/11/13 215 lb (97.523 kg)  07/21/13 215 lb 1.6 oz (97.569 kg)  '  PHYSICAL EXAM BP 122/74 mmHg  Pulse 86  Ht 5\' 9"  (1.753 m)  Wt 213 lb 9.6 oz (96.888 kg)  BMI 31.53 kg/m2 General appearance: alert, cooperative, appears stated age, no distress and mildly obese HEENT: [place]/AT, EOMI, MMM, anicteric sclera Neck: no adenopathy, no carotid bruit, no JVD and supple, symmetrical, trachea midline Lungs: clear to auscultation bilaterally, normal percussion bilaterally and Nonlabored, good air movement Heart: RRR, normal S1 and S2. Soft 1/6 SEM at LUSB. No other significant M./R./G.; Significant ectopy Abdomen: soft, non-tender; bowel sounds normal; no masses, no organomegaly Extremities: extremities normal, atraumatic, no cyanosis or edema Pulses: 2+ and symmetric Neurologic: Alert and oriented X 3, normal strength and tone. Normal symmetric reflexes. Normal coordination and gait    Adult ECG Report  Rate: 86 ;  Rhythm: normal sinus rhythm and premature ventricular contractions (PVC)  Narrative  Interpretation: Stable EKG  Recent Labs:  None since 07/2013  ASSESSMENT / PLAN: Overall, remains stable from a CV standpoint.  BP well controlled, due for Lipid check. + PVCs but asymptomatic.  Problem List Items Addressed This Visit    Dyslipidemia, goal LDL below 100 - on statin & fibrate - Primary (Chronic)    Previously well controlled.   Check labs today - CMP, Lipids      Relevant Orders   Lipid panel   Comprehensive metabolic panel   EKG 40-JWJX   Frequent PVCs (Chronic)    Monomorphic.   Asymptomatic - no need to treat.      Relevant Orders   Lipid panel   Comprehensive metabolic panel   EKG 91-YNWG   Obesity (BMI 30.0-34.9) (Chronic)  The patient understands the need to lose weight with diet and exercise. We have discussed specific strategies for this.      Relevant Orders   Lipid panel   Comprehensive metabolic panel   EKG 59-RCBU    Other Visit Diagnoses    Polypharmacy        Relevant Orders    Lipid panel    Comprehensive metabolic panel    EKG 38-GTXM       Orders Placed This Encounter  Procedures  . Lipid panel    Order Specific Question:  Has the patient fasted?    Answer:  Yes  . Comprehensive metabolic panel    Order Specific Question:  Has the patient fasted?    Answer:  Yes  . EKG 12-Lead   Meds ordered this encounter  Medications  . HYDROcodone-acetaminophen (NORCO/VICODIN) 5-325 MG per tablet    Sig: Take 1 tablet by mouth.    Refill:  0    Followup: 1 yr    Leonie Man, M.D., M.S. Interventional Cardiologist   Pager # [phone]

## 2014-08-22 ENCOUNTER — Encounter: Payer: Self-pay | Admitting: *Deleted

## 2014-08-22 ENCOUNTER — Telehealth: Payer: Self-pay | Admitting: *Deleted

## 2014-08-22 NOTE — Telephone Encounter (Signed)
-----   Message from Leonie Man, MD sent at 08/21/2014  1:51 PM EDT ----- Labs all look as well controlled as previously controlled. Triglycerides have increased to 209 total cholesterol 274 compared to 164 and 146. HDL is stable, but LDL is increased from 66-93. We have told our goal for LDL and HDL. However returning the wrong direction. Need to watch diet and exercise, and may need to adjust medications in the future.  Leonie Man, MD  Please foward to PCP. Dr. Sharilyn Sites

## 2014-08-22 NOTE — Telephone Encounter (Signed)
Spoke to patient. Result given. Patient request a copy of results to be mailed.labs routed to Dr Hilma Favors.  Verbalized understanding

## 2015-08-14 ENCOUNTER — Ambulatory Visit (INDEPENDENT_AMBULATORY_CARE_PROVIDER_SITE_OTHER): Payer: BLUE CROSS/BLUE SHIELD | Admitting: Cardiology

## 2015-08-14 VITALS — BP 116/72 | HR 76 | Ht 68.0 in | Wt 215.4 lb

## 2015-08-14 DIAGNOSIS — I493 Ventricular premature depolarization: Secondary | ICD-10-CM

## 2015-08-14 DIAGNOSIS — Z79899 Other long term (current) drug therapy: Secondary | ICD-10-CM | POA: Diagnosis not present

## 2015-08-14 DIAGNOSIS — E669 Obesity, unspecified: Secondary | ICD-10-CM | POA: Diagnosis not present

## 2015-08-14 DIAGNOSIS — E785 Hyperlipidemia, unspecified: Secondary | ICD-10-CM

## 2015-08-14 LAB — COMPREHENSIVE METABOLIC PANEL
ALK PHOS: 30 U/L — AB (ref 40–115)
ALT: 14 U/L (ref 9–46)
AST: 20 U/L (ref 10–35)
Albumin: 4.4 g/dL (ref 3.6–5.1)
BUN: 25 mg/dL (ref 7–25)
CO2: 25 mmol/L (ref 20–31)
Calcium: 9.5 mg/dL (ref 8.6–10.3)
Chloride: 105 mmol/L (ref 98–110)
Creat: 1.15 mg/dL (ref 0.70–1.25)
GLUCOSE: 80 mg/dL (ref 65–99)
POTASSIUM: 5 mmol/L (ref 3.5–5.3)
Sodium: 142 mmol/L (ref 135–146)
Total Bilirubin: 0.6 mg/dL (ref 0.2–1.2)
Total Protein: 6.9 g/dL (ref 6.1–8.1)

## 2015-08-14 LAB — LIPID PANEL
CHOL/HDL RATIO: 2.8 ratio (ref <=5.0)
Cholesterol: 113 mg/dL — ABNORMAL LOW (ref 125–200)
HDL: 41 mg/dL (ref >=40)
LDL CALC: 48 mg/dL (ref <130)
Triglycerides: 122 mg/dL (ref <150)
VLDL: 24 mg/dL (ref <30)

## 2015-08-14 NOTE — Patient Instructions (Signed)
NO CHANGE WITH CURRENT MEDICATIONS   Your physician wants you to follow-up in 12 MONTHS WITH DR HARDING. You will receive a reminder letter in the mail two months in advance. If you don't receive a letter, please call our office to schedule the follow-up appointment.     If you need a refill on your cardiac medications before your next appointment, please call your pharmacy.  

## 2015-08-14 NOTE — Progress Notes (Signed)
PCP: Purvis Kilts, MD  Clinic Note: Chief Complaint  Patient presents with  . Annual Exam    pt denied chest pain and SOB    HPI: Charles [name] is a 65 y.o. male with a PMH below who presents today for annual f/u for CRFs & family history.  Charles [name] was last seen in April 2016 Recent Hospitalizations: none except for recent umbilical hernia Sgx. (Had Bilateral Shoulder Sgx last year April & July 2016)  Studies Reviewed: None  Interval History: Mr. Friedland seems to be doing pretty well today. He got out of the habit of doing his exercise following all of his surgeries. He is now not quite back into full excise regimen, he is purposely trying to get back into it however. The only major complaint he has disease had some occasional vertigo and he still has pain in both knees. His shoulder is deathly feel better following his surgery. He really denies any active cardiac symptoms with the review of symptoms as follows:   No chest pain or shortness of breath with rest or exertion.  No PND, orthopnea or edema.  No palpitations, lightheadedness, dizziness (except positional vertigo), weakness or syncope/near syncope. No TIA/amaurosis fugax symptoms. No melena, hematochezia, hematuria, or epstaxis. No claudication.  ROS: A comprehensive was performed. Review of Systems  Constitutional: Negative for malaise/fatigue.  HENT: Negative for nosebleeds.   Eyes: Negative for blurred vision and double vision.  Respiratory: Negative for cough and shortness of breath.   Cardiovascular: Negative.        Per HPI  Gastrointestinal: Negative for abdominal pain, constipation and melena.  Genitourinary: Negative for hematuria.  Musculoskeletal: Positive for joint pain (knees). Negative for falls.       Knee pain limits activity  Neurological: Positive for dizziness (Occasional Vertigo). Negative for sensory change, speech change, focal weakness, seizures, loss of consciousness and  headaches.  Endo/Heme/Allergies: Does not bruise/bleed easily.  Psychiatric/Behavioral: Negative for depression and memory loss. The patient is not nervous/anxious and does not have insomnia.   All other systems reviewed and are negative.   Past Medical History  Diagnosis Date  . Ventral hernia 11/30/2012    ASYMPTOMATIC  . Umbilical hernia [id number]    ASYMPTOMATIC WITH NO GI  SYMPTOMS  . Hyperlipidemia     well controlled  . Sleep apnea, central 05/31/2009    NOCTURNAL POLYSOMNOGRAM -(DR Constance Holster, ENT) MILD SLEEP APNEA SYNDROME ,MILD HYPOVENTILATION ; ABN SLEEP ARCHITECTURE WITH POOR SLEEP EFFICIENCY AND REDUCED SLOW WAVE SLEEP RESULTS AN INCREASESD STAGE 2 SLEEP. ;; REC 2 L QHS O2 - No longer on CPAP - Had ENT surgery in 2008    Past Surgical History  Procedure Laterality Date  . Doppler echocardiography  09/08/2011    EF [id number] ,LV SYSTOLIC FUNCTION NORMAL   . Treadmill exercise stress test  01/01/2012; 07/2013    a) 9/'13:NORMOTENSIVE REPONSE TO EXERCISE; GOOD EXERCISE TOLERANCE;NO ECG evidence of exercise-induced ischemia; rare PVCs;; b) 4/'15: Non-ischemic, EF 62%  . Lumbar laminectomy  1970  . Nasal septum surgery  1970  . Foot surgery Right 1985  . Tonsillectomy  10/11/2008    DR Constance Holster-- with uvulopalatophrayngoplasty,revision nasal septolasty    Prior to Admission medications   Medication Sig Start Date End Date Taking? Authorizing Provider  Ascorbic Acid (VITAMIN C PO) Take 1,000 mg by mouth.   Yes Historical Provider, MD  aspirin EC 81 MG tablet Take 81 mg by mouth daily.   Yes Historical Provider, MD  Calcium Carbonate-Vit D-Min (CALCIUM 1200 PO) Take by mouth.   Yes Historical Provider, MD  Cholecalciferol (VITAMIN D3) 400 UNITS CAPS Take by mouth.   Yes Historical Provider, MD  Coenzyme Q10 (CO Q 10 PO) Take 30 mg by mouth daily.   Yes Historical Provider, MD  Cyanocobalamin (VITAMIN B 12 PO) Take 1,000 mg by mouth daily.   Yes Historical Provider, MD  fenofibrate 160 MG  tablet Take 160 mg by mouth daily.   Yes Historical Provider, MD  folic acid (FOLVITE) 1 MG tablet Take 1 mg by mouth daily.   Yes Historical Provider, MD  GARLIC PO Take by mouth.   Yes Historical Provider, MD  Glucosamine HCl (GLUCOSAMINE PO) Take by mouth.   Yes Historical Provider, MD  HYDROcodone-acetaminophen (NORCO/VICODIN) 5-325 MG per tablet Take 1 tablet by mouth. 07/06/14  Yes Historical Provider, MD  ibuprofen (ADVIL,MOTRIN) 200 MG tablet Take 200 mg by mouth every 6 (six) hours as needed.   Yes Historical Provider, MD  Magnesium 200 MG TABS Take by mouth.   Yes Historical Provider, MD  meloxicam (MOBIC) 15 MG tablet Take 15 mg by mouth daily. 07/16/15  Yes Historical Provider, MD  Multiple Vitamin (MULTIVITAMIN) tablet Take 1 tablet by mouth daily.   Yes Historical Provider, MD  Omega-3 Fatty Acids (FISH OIL PO) Take by mouth. Take 2 caps in morning and 1 caps in evening   Yes Historical Provider, MD  simvastatin (ZOCOR) 20 MG tablet Take 20 mg by mouth daily.   Yes Historical Provider, MD  tamsulosin (FLOMAX) 0.4 MG CAPS capsule Take 0.4 mg by mouth daily after supper.   Yes Historical Provider, MD  Zinc 50 MG TABS Take by mouth daily.   Yes Historical Provider, MD   Allergies  Allergen Reactions  . Penicillins     Social History   Social History  . Marital Status: Married    Spouse Name: N/A  . Number of Children: N/A  . Years of Education: N/A   Social History Main Topics  . Smoking status: Never Smoker   . Smokeless tobacco: None  . Alcohol Use: Yes  . Drug Use: No  . Sexual Activity: Not Asked   Other Topics Concern  . None   Social History Narrative   Married father of 2 with 1 grandchild. 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   He does not do any regular walking.  He is to use his  home workout machine, but now is noted that he really hasn't had the same amount of time at the end of the day the use to. Using working from 6 AM to 7 PM.   Family History  Problem Relation Age of Onset  . Hyperlipidemia Mother   . Heart disease Father 78  . Alzheimer's disease Father   . Heart disease Brother   . Diabetes Brother     Wt Readings from Last 3 Encounters:  08/14/15 215 lb 6.4 oz (97.705 kg)  08/14/14 213 lb 9.6 oz (96.888 kg)  08/11/13 215 lb (97.523 kg)    PHYSICAL EXAM BP 116/72 mmHg  Pulse 76  Ht 5\' 8"  (1.727 m)  Wt 215 lb 6.4 oz (97.705 kg)  BMI 32.76 kg/m2 General appearance: alert, cooperative, appears  stated age, no distress and mildly obese HEENT: [place]/AT, EOMI, MMM, anicteric sclera Neck: no adenopathy, no carotid bruit, no JVD and supple, symmetrical, trachea midline Lungs: clear to auscultation bilaterally, normal percussion bilaterally and Nonlabored, good air movement Heart: RRR, normal S1 and S2. Soft 1/6 SEM at LUSB. No other significant M./R./G.; Significant ectopy Abdomen: soft, non-tender; bowel sounds normal; no masses, no organomegaly Extremities: extremities normal, atraumatic, no cyanosis or edema Pulses: 2+ and symmetric Neurologic: Alert and oriented X 3, normal strength and tone. Normal symmetric reflexes. Normal coordination and gait   Adult ECG Report  Rate: 76 ;  Rhythm: normal sinus rhythm and premature ventricular contractions (PVC); normal axis, intervals & durations.  Narrative Interpretation: Otherwise normal EKG - stable   Other studies Reviewed: Additional studies/ records that were reviewed today include:  Recent Labs:   Lab Results  Component Value Date   CHOL 113* 08/14/2015   HDL 41 08/14/2015   LDLCALC 48 08/14/2015   TRIG 122 08/14/2015   CHOLHDL 2.8 08/14/2015      ASSESSMENT / PLAN: Problem List Items Addressed This Visit    Obesity (BMI 30.0-34.9) (Chronic)    The patient understands the need to lose  weight with diet and exercise. We have discussed specific strategies for this. Now that he has recovered from most his surgeries, he should go to get back into exercise. He still has knee pain which limits in however.      Frequent PVCs (Chronic)    Monomorphic PVCs. Essentially asymptomatic. For now with negative ischemic evaluation, I would not treat with beta blocker. However low threshold to start treatment if he does have symptoms.      Relevant Orders   EKG 12-Lead (Completed)   Comprehensive metabolic panel (Completed)   Lipid panel (Completed)   Dyslipidemia, goal LDL below 100 - on statin & fibrate - Primary (Chronic)    Not checked in over a year. We will go ahead and recheck labs now as he is on a statin plus fenofibrate. Most notable abnormality last check was elevated triglycerides. Hopefully the fibrate will help that.      Relevant Orders   EKG 12-Lead (Completed)   Comprehensive metabolic panel (Completed)   Lipid panel (Completed)    Other Visit Diagnoses    Drug therapy        Relevant Orders    Comprehensive metabolic panel (Completed)    Lipid panel (Completed)       Current medicines are reviewed at length with the patient today. (+/- concerns) None The following changes have been made: None Studies Ordered:   Orders Placed This Encounter  Procedures  . Comprehensive metabolic panel  . Lipid panel  . EKG 12-Lead   Follow-up in 1- 1 1/2 years     Shelisha Gautier, Leonie Green, M.D., M.S. Interventional Cardiologist   Pager # [phone] Phone # [phone] [address] [place], [place] [address]

## 2015-08-16 ENCOUNTER — Encounter: Payer: Self-pay | Admitting: Cardiology

## 2015-08-16 NOTE — Assessment & Plan Note (Signed)
The patient understands the need to lose weight with diet and exercise. We have discussed specific strategies for this. Now that he has recovered from most his surgeries, he should go to get back into exercise. He still has knee pain which limits in however.

## 2015-08-16 NOTE — Assessment & Plan Note (Signed)
Not checked in over a year. We will go ahead and recheck labs now as he is on a statin plus fenofibrate. Most notable abnormality last check was elevated triglycerides. Hopefully the fibrate will help that.

## 2015-08-16 NOTE — Assessment & Plan Note (Signed)
Monomorphic PVCs. Essentially asymptomatic. For now with negative ischemic evaluation, I would not treat with beta blocker. However low threshold to start treatment if he does have symptoms.

## 2015-08-28 ENCOUNTER — Telehealth: Payer: Self-pay | Admitting: *Deleted

## 2015-08-28 NOTE — Telephone Encounter (Signed)
LEFT MESSAGE  TO CALL BACK -- IN REGARDS TO LABS 

## 2015-08-28 NOTE — Telephone Encounter (Signed)
-----   Message from Leonie Man, MD sent at 08/27/2015  6:21 PM EDT ----- Liver and kidney function looked good on chemistries. Overall cholesterol looks much better. Triglycerides are definitely improved as has LDL and HDL. Otherwise continue current regimen for now.  Leonie Man, MD  Please forward to The PCP user Sharilyn Sites, MD [

## 2015-09-07 ENCOUNTER — Encounter: Payer: Self-pay | Admitting: *Deleted

## 2015-10-04 NOTE — Telephone Encounter (Signed)
LATE ENTRY-- MAILED LETTER ON 09/07/15

## 2016-08-13 ENCOUNTER — Ambulatory Visit (INDEPENDENT_AMBULATORY_CARE_PROVIDER_SITE_OTHER): Payer: BLUE CROSS/BLUE SHIELD | Admitting: Cardiology

## 2016-08-13 ENCOUNTER — Encounter: Payer: Self-pay | Admitting: Cardiology

## 2016-08-13 VITALS — BP 124/70 | HR 79 | Ht 68.0 in | Wt 209.0 lb

## 2016-08-13 DIAGNOSIS — I493 Ventricular premature depolarization: Secondary | ICD-10-CM | POA: Diagnosis not present

## 2016-08-13 DIAGNOSIS — E669 Obesity, unspecified: Secondary | ICD-10-CM

## 2016-08-13 DIAGNOSIS — E785 Hyperlipidemia, unspecified: Secondary | ICD-10-CM | POA: Diagnosis not present

## 2016-08-13 DIAGNOSIS — Z8249 Family history of ischemic heart disease and other diseases of the circulatory system: Secondary | ICD-10-CM | POA: Diagnosis not present

## 2016-08-13 NOTE — Patient Instructions (Signed)
  No change with current medications    Your physician wants you to follow-up in 12 months with Dr Ellyn Hack. You will receive a reminder letter in the mail two months in advance. If you don't receive a letter, please call our office to schedule the follow-up appointment.   If you need a refill on your cardiac medications before your next appointment, please call your pharmacy.

## 2016-08-13 NOTE — Assessment & Plan Note (Addendum)
Mostly based on his family history his target LDL is lower - thankfully, he doesn't have hypertension or diabetes. On Fenofibrate for High triglycerides levels.

## 2016-08-13 NOTE — Assessment & Plan Note (Signed)
I congratulated him on his efforts with weight loss and increasing exercise. I told him that really the main way for [place] to know if we need to do another stress test is if he were to have symptoms. His PCP is monitoring his lipids and his blood pressure looks great. Provided he continues to stay active and has no exertional chest tightness or dyspnea, I would simply monitor. He is taking a baby aspirin for prophylaxis.

## 2016-08-13 NOTE — Assessment & Plan Note (Signed)
Despite having frequent monomorphic PVCs on his EKG, he did not file room. He also had ectopy on exam that he did not feel. In the absence of symptoms, I would not consider treating PVCs with beta blocker. He had a history of a negative ischemic evaluation with normal structural heart. Would only treat if symptomatic.

## 2016-08-13 NOTE — Progress Notes (Signed)
PCP: Purvis Kilts, MD  Clinic Note: Chief Complaint  Patient presents with  . Follow-up    1 year. Cardiac RFs, PVCs (asymptomatic)    HPI: Charles [name] is a 66 y.o. male with a PMH below who presents today for Follow-up of cardiac risk factors and family history of CAD.Marland Kitchen  Charles [name] was last seen on 08/14/2015  Recent Hospitalizations: None  Studies Reviewed: None  Interval History: Charles [name] continues to do very well. Charles [name] is not having any cardiac symptoms. Charles [name] is monitoring his heart rate and activity level with using his FIT BIT. Charles [name] is trying to get ~10K steps/day on Fit Bit & tries to go for walks more frequently. Charles [name]'s been doing fairly well and thinks Charles [name] is done a good job losing weight. Charles [name] feels better, better energy level. Charles [name] denies any sensation of any irregular heartbeats or palpitations. Denies any chest pressure/tightness or dyspnea with rest or exertion. No PND, orthopnea or edema. No palpitations, lightheadedness, dizziness, weakness or syncope/near syncope. No TIA/amaurosis fugax symptoms. No claudication.  ROS: A comprehensive was performed. Review of Systems  Constitutional: Negative for malaise/fatigue.  HENT: Negative for congestion, hearing loss, nosebleeds and sinus pain.   Respiratory: Negative for cough, shortness of breath and wheezing.   Gastrointestinal: Negative for blood in stool and melena.  Genitourinary: Negative for hematuria.       Charles [name] needs take his Flomax in order to help him urinate.  Takes when necessary sildenafil  Musculoskeletal: Positive for joint pain (Charles [name] does have knee pain which keeps him from being out of a more aggressive with walking.). Negative for myalgias (Tolerating simvastatin.).  Neurological: Positive for dizziness (Intermittent episodes of vertigo. Charles [name] takes when necessary meclizine). Negative for tremors.  Endo/Heme/Allergies: Negative for environmental allergies (Charles [name] has managed to avoid seasonal allergies because Charles [name]  has been eating his home on honey or years. Ever since doing so his allergies and in better.).  Psychiatric/Behavioral:       Charles [name] sleeps well with no anxiety. Charles [name] feels good most of the day. No anxiety or depression symptoms.  All other systems reviewed and are negative.   Past Medical History:  Diagnosis Date  . Hyperlipidemia    well controlled  . Sleep apnea, central 05/31/2009   NOCTURNAL POLYSOMNOGRAM -(DR Constance Holster, ENT) MILD SLEEP APNEA SYNDROME ,MILD HYPOVENTILATION ; ABN SLEEP ARCHITECTURE WITH POOR SLEEP EFFICIENCY AND REDUCED SLOW WAVE SLEEP RESULTS AN INCREASESD STAGE 2 SLEEP. ;; REC 2 L QHS O2 - No longer on CPAP - Had ENT surgery in 2008  . Umbilical hernia 11/02/6765   ASYMPTOMATIC WITH NO GI  SYMPTOMS  . Ventral hernia 11/30/2012   ASYMPTOMATIC    Past Surgical History:  Procedure Laterality Date  . DOPPLER ECHOCARDIOGRAPHY  09/08/2011   EF =>20% ,LV SYSTOLIC FUNCTION NORMAL   . FOOT SURGERY Right 1985  . LUMBAR LAMINECTOMY  1970  . NASAL SEPTUM SURGERY  1970  . TONSILLECTOMY  10/11/2008   DR ROSEN-- with uvulopalatophrayngoplasty,revision nasal septolasty  . TREADMILL EXERCISE STRESS TEST  01/01/2012; 07/2013   a) 9/'13:NORMOTENSIVE REPONSE TO EXERCISE; GOOD EXERCISE TOLERANCE;NO ECG evidence of exercise-induced ischemia; rare PVCs;; b) 4/'15: Non-ischemic, EF 62%    Current Meds  Medication Sig  . Ascorbic Acid (VITAMIN C PO) Take 1,000 mg by mouth.  Marland Kitchen aspirin EC 81 MG tablet Take 81 mg by mouth daily.  Marland Kitchen BIOTIN PO Take 1 tablet by mouth as needed.  . Calcium Carbonate-Vit D-Min (CALCIUM 1200 PO)  Take by mouth.  . Cholecalciferol (VITAMIN D3) 400 UNITS CAPS Take by mouth.  . Coenzyme Q10 (CO Q 10 PO) Take 30 mg by mouth daily.  . Cyanocobalamin (VITAMIN B 12 PO) Take 1,000 mg by mouth daily.  . fenofibrate 160 MG tablet Take 160 mg by mouth daily.  . folic acid (FOLVITE) 1 MG tablet Take 1 mg by mouth daily.  Marland Kitchen GARLIC PO Take by mouth.  . Glucosamine HCl (GLUCOSAMINE PO)  Take by mouth.  Marland Kitchen HYDROcodone-acetaminophen (NORCO/VICODIN) 5-325 MG per tablet Take 1 tablet by mouth.  Marland Kitchen ibuprofen (ADVIL,MOTRIN) 200 MG tablet Take 200 mg by mouth every 6 (six) hours as needed.  . Magnesium 200 MG TABS Take by mouth.  . Melatonin 5 MG TABS Take 5 mg by mouth daily.  . meloxicam (MOBIC) 15 MG tablet Take 15 mg by mouth as needed.   . Multiple Vitamin (MULTIVITAMIN) tablet Take 1 tablet by mouth daily.  . Omega-3 Fatty Acids (FISH OIL PO) Take by mouth. Take 2 caps in morning and 1 caps in evening  . sildenafil (REVATIO) 20 MG tablet Take 20 mg by mouth as needed.  . simvastatin (ZOCOR) 20 MG tablet Take 20 mg by mouth daily.  . tamsulosin (FLOMAX) 0.4 MG CAPS capsule Take 0.4 mg by mouth daily after supper.  . TURMERIC PO Take 1 tablet by mouth.  . Zinc 50 MG TABS Take by mouth daily.    Allergies  Allergen Reactions  . Penicillins     Social History   Social History  . Marital status: Married    Spouse name: N/A  . Number of children: N/A  . Years of education: N/A   Social History Main Topics  . Smoking status: Never Smoker  . Smokeless tobacco: Never Used  . Alcohol use Yes  . Drug use: No  . Sexual activity: Not Asked   Other Topics Concern  . None   Social History Narrative   Married father of 2 with 1 grandchild. 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   Charles [name] does not do any regular walking.  Charles [name] is to use his home workout machine, but now is noted that Charles [name] really hasn't had the same amount of time at the end of the day the use to. Using working from 6 AM to 7 PM.    family history includes Alzheimer's disease in his father; Diabetes in his brother; Heart disease in his brother; Heart disease (age of onset: 21) in his father; Hyperlipidemia in his mother.  Wt Readings from  Last 3 Encounters:  08/13/16 209 lb (94.8 kg)  08/14/15 215 lb 6.4 oz (97.7 kg)  08/14/14 213 lb 9.6 oz (96.9 kg)    PHYSICAL EXAM BP 124/70   Pulse 79   Ht 5\' 8"  (1.727 m)   Wt 209 lb (94.8 kg)   BMI 31.78 kg/m  General appearance: alert, cooperative, appears stated age, no distress and Mildly obese. Well-nourished and well-groomed. HEENT: [place]/AT, EOMI, MMM, anicteric sclera Neck: no adenopathy, no carotid bruit and no JVD Lungs: clear to auscultation bilaterally, normal percussion bilaterally and non-labored Heart: regular rate and rhythmWith relatively frequent ectopy, S1& S2 normal, 1/6 SEM @ LLSB; no click, rub or gallop ; nondisplaced PMI Abdomen:  soft, non-tender; bowel sounds normal; no masses,  no organomegaly; mild truncal obesity Extremities: extremities normal, atraumatic, no cyanosis, or edema  Pulses: 2+ and symmetric; Neurologic: Mental status: Alert, oriented, thought content appropriate   Adult ECG Report  Rate: 79 ;  Rhythm: normal sinus rhythm, premature ventricular contractions (PVC) and Otherwise normal axis, intervals and durations.;   Narrative Interpretation: stable EKG   Other studies Reviewed: Additional studies/ records that were reviewed today include:  Recent Labs:  Followed by PCP. Is due to have his labs checked soon.  Lab Results  Component Value Date   CHOL 113 (L) 08/14/2015   HDL 41 08/14/2015   LDLCALC 48 08/14/2015   TRIG 122 08/14/2015   CHOLHDL 2.8 08/14/2015    ASSESSMENT / PLAN: Problem List Items Addressed This Visit    Dyslipidemia, goal LDL below 100 - on statin & fibrate (Chronic)    Mostly based on his family history his target LDL is lower - thankfully, Charles [name] doesn't have hypertension or diabetes. On Fenofibrate for High triglycerides levels.      Relevant Medications   sildenafil (REVATIO) 20 MG tablet   Family history of cardiovascular disease    I congratulated him on his efforts with weight loss and increasing exercise. I  told him that really the main way for [place] to know if we need to do another stress test is if Charles [name] were to have symptoms. His PCP is monitoring his lipids and his blood pressure looks great. Provided Charles [name] continues to stay active and has no exertional chest tightness or dyspnea, I would simply monitor. Charles [name] is taking a baby aspirin for prophylaxis.      Relevant Orders   EKG 12-Lead   Frequent PVCs - Primary (Chronic)    Despite having frequent monomorphic PVCs on his EKG, Charles [name] did not file room. Charles [name] also had ectopy on exam that Charles [name] did not feel. In the absence of symptoms, I would not consider treating PVCs with beta blocker. Charles [name] had a history of a negative ischemic evaluation with normal structural heart. Would only treat if symptomatic.      Relevant Medications   sildenafil (REVATIO) 20 MG tablet   Other Relevant Orders   EKG 12-Lead   Obesity (BMI 30.0-34.9) (Chronic)    Doing well. Charles [name] is steadily losing weight. Making a concerted effort to watch his diet and be more cognizant of exercising.         Current medicines are reviewed at length with the patient today. (+/- concerns) n/a The following changes have been made: n/a  Patient Instructions   No change with current medications    Your physician wants you to follow-up in 12 months with Dr Ellyn Hack. You will receive a reminder letter in the mail two months in advance. If you don't receive a letter, please call our office to schedule the follow-up appointment.   If you need a refill on your cardiac medications before your next appointment, please call your pharmacy.     Studies Ordered:   Orders Placed This Encounter  Procedures  . EKG 12-Lead      Glenetta Hew, M.D., M.S. Interventional Cardiologist   Pager # [phone] Phone # [phone] [address] [place],  [address]

## 2016-08-13 NOTE — Assessment & Plan Note (Signed)
Doing well. He is steadily losing weight. Making a concerted effort to watch his diet and be more cognizant of exercising.

## 2017-01-26 ENCOUNTER — Other Ambulatory Visit: Payer: Self-pay | Admitting: Physical Medicine and Rehabilitation

## 2017-01-26 DIAGNOSIS — M542 Cervicalgia: Secondary | ICD-10-CM

## 2017-01-29 ENCOUNTER — Ambulatory Visit
Admission: RE | Admit: 2017-01-29 | Discharge: 2017-01-29 | Disposition: A | Discharge status: Home / Self Care | Payer: BLUE CROSS/BLUE SHIELD | Source: Ambulatory Visit | Attending: Physical Medicine and Rehabilitation | Admitting: Physical Medicine and Rehabilitation

## 2017-01-29 DIAGNOSIS — M542 Cervicalgia: Secondary | ICD-10-CM

## 2017-01-29 MED ORDER — IOPAMIDOL (ISOVUE-M 300) INJECTION 61%
1.0000 mL | Freq: Once | INTRAMUSCULAR | Status: AC | PRN
  Administered 2017-01-29: 1 mL via EPIDURAL

## 2017-01-29 MED ORDER — TRIAMCINOLONE ACETONIDE 40 MG/ML IJ SUSP (RADIOLOGY)
60.0000 mg | Freq: Once | INTRAMUSCULAR | Status: AC
  Administered 2017-01-29: 60 mg via EPIDURAL

## 2017-01-29 NOTE — Discharge Instructions (Signed)

## 2017-07-06 ENCOUNTER — Other Ambulatory Visit: Payer: Self-pay | Admitting: Physical Medicine and Rehabilitation

## 2017-07-06 DIAGNOSIS — M542 Cervicalgia: Secondary | ICD-10-CM

## 2017-07-14 ENCOUNTER — Ambulatory Visit
Admission: RE | Admit: 2017-07-14 | Discharge: 2017-07-14 | Disposition: A | Discharge status: Home / Self Care | Payer: Medicare HMO | Source: Ambulatory Visit | Attending: Physical Medicine and Rehabilitation | Admitting: Physical Medicine and Rehabilitation

## 2017-07-14 DIAGNOSIS — M47812 Spondylosis without myelopathy or radiculopathy, cervical region: Secondary | ICD-10-CM | POA: Diagnosis not present

## 2017-07-14 DIAGNOSIS — M542 Cervicalgia: Secondary | ICD-10-CM

## 2017-07-14 MED ORDER — TRIAMCINOLONE ACETONIDE 40 MG/ML IJ SUSP (RADIOLOGY)
60.0000 mg | Freq: Once | INTRAMUSCULAR | Status: AC
  Administered 2017-07-14: 60 mg via EPIDURAL

## 2017-07-14 MED ORDER — IOPAMIDOL (ISOVUE-M 300) INJECTION 61%
1.0000 mL | Freq: Once | INTRAMUSCULAR | Status: AC | PRN
  Administered 2017-07-14: 1 mL via EPIDURAL

## 2017-07-14 NOTE — Discharge Instructions (Signed)

## 2017-08-18 ENCOUNTER — Ambulatory Visit: Payer: Medicare HMO | Admitting: Cardiology

## 2017-08-18 ENCOUNTER — Encounter: Payer: Self-pay | Admitting: Cardiology

## 2017-08-18 VITALS — BP 142/64 | HR 75 | Ht 68.0 in | Wt 190.6 lb

## 2017-08-18 DIAGNOSIS — Z8249 Family history of ischemic heart disease and other diseases of the circulatory system: Secondary | ICD-10-CM | POA: Diagnosis not present

## 2017-08-18 DIAGNOSIS — I493 Ventricular premature depolarization: Secondary | ICD-10-CM | POA: Diagnosis not present

## 2017-08-18 DIAGNOSIS — E669 Obesity, unspecified: Secondary | ICD-10-CM | POA: Diagnosis not present

## 2017-08-18 DIAGNOSIS — E785 Hyperlipidemia, unspecified: Secondary | ICD-10-CM | POA: Diagnosis not present

## 2017-08-18 NOTE — Progress Notes (Signed)
PCP: Sharilyn Sites, MD  Clinic Note: Chief Complaint  Patient presents with  . Follow-up    No complaints  . Palpitations    History of symptomatic PVCs, -not bothering him    HPI: Charles [name] is a 67 y.o. male with a PMH below who presents today for annual follow-up for monitoring of cardiac risk factors given his family history of CAD (father had an MI in his 64s, but his brother had multivessel disease with CABG at 37 died at age 64 -but he had diabetes).  Charles [name] was last seen on August 13, 2016 -doing well with no cardiac symptoms.  Staying active.  Monitoring his activity with his FITBIT.  Try to do roughly 10,000 steps a day.  No chest pain etc.  Recent Hospitalizations: None  Studies Personally Reviewed - (if available, images/films reviewed: From Epic Chart or Care Everywhere)  None  Interval History: Charles [name] returns today doing really well.  He started doing weight watchers with his wife and is picked up his exercise level now.  He does about 30 minutes every morning either walking on the treadmill or doing a stationary bike.  He is hoping to try to get down into the 160s.  He has been bothered a little bit by some orthopedic injuries and pain that is limited his exercise of late.  It seems to be cervical stenosis.  He has had intermittent steroid shots that is done relatively well but are not lasting as long now.  No chest pain or shortness of breath with rest or exertion.  No PND, orthopnea or edema. No palpitations (very rare occasional palpitations that last seconds), lightheadedness, dizziness, weakness or syncope/near syncope. No TIA/amaurosis fugax symptoms.  No claudication.  ROS: A comprehensive was performed. Review of Systems  Constitutional: Positive for weight loss (Intentional weight loss of close to 20 pounds -increased exercise, and doing weight watchers with his wife.). Negative for chills, fever and malaise/fatigue.  HENT: Negative for  congestion and nosebleeds.   Respiratory: Negative for shortness of breath.   Gastrointestinal: Negative for abdominal pain, blood in stool, heartburn and melena.  Genitourinary: Negative for hematuria.       Uses Flomax to help with urination.  Takes as needed sildenafil  Musculoskeletal: Positive for joint pain (Knee pain) and neck pain (notes C-spine stenosis related radicular pain to both shoulders). Negative for myalgias.  Endo/Heme/Allergies: Negative for environmental allergies (Eats local honey that he makes.).  Psychiatric/Behavioral: Negative for memory loss. The patient is not nervous/anxious and does not have insomnia.   All other systems reviewed and are negative.   I have reviewed and (if needed) personally updated the patient's problem list, medications, allergies, past medical and surgical history, social and family history.   Past Medical History:  Diagnosis Date  . Hyperlipidemia    well controlled  . Sleep apnea, central 05/31/2009   NOCTURNAL POLYSOMNOGRAM -(DR Constance Holster, ENT) MILD SLEEP APNEA SYNDROME ,MILD HYPOVENTILATION ; ABN SLEEP ARCHITECTURE WITH POOR SLEEP EFFICIENCY AND REDUCED SLOW WAVE SLEEP RESULTS AN INCREASESD STAGE 2 SLEEP. ;; REC 2 L QHS O2 - No longer on CPAP - Had ENT surgery in 2008  . Umbilical hernia 0/04/6008   ASYMPTOMATIC WITH NO GI  SYMPTOMS  . Ventral hernia 11/30/2012   ASYMPTOMATIC    Past Surgical History:  Procedure Laterality Date  . DOPPLER ECHOCARDIOGRAPHY  09/08/2011   EF =>93% ,LV SYSTOLIC FUNCTION NORMAL   . FOOT SURGERY Right 1985  . LUMBAR LAMINECTOMY  [facility]  . TONSILLECTOMY  10/11/2008   DR ROSEN-- with uvulopalatophrayngoplasty,revision nasal septolasty  . TREADMILL EXERCISE STRESS TEST  01/01/2012; 07/2013   a) 9/'13:NORMOTENSIVE REPONSE TO EXERCISE; GOOD EXERCISE TOLERANCE;NO ECG evidence of exercise-induced ischemia; rare PVCs;; b) 4/'15: Non-ischemic, EF 62%    Current Meds  Medication Sig  .  Ascorbic Acid (VITAMIN C PO) Take 1,000 mg by mouth.  Marland Kitchen aspirin EC 81 MG tablet Take 81 mg by mouth daily.  Marland Kitchen BIOTIN PO Take 1 tablet by mouth as needed.  . Calcium Carbonate-Vit D-Min (CALCIUM 1200 PO) Take by mouth.  . Cholecalciferol (VITAMIN D3) 400 UNITS CAPS Take by mouth.  . Coenzyme Q10 (CO Q 10 PO) Take 30 mg by mouth daily.  . Cyanocobalamin (VITAMIN B 12 PO) Take 1,000 mg by mouth daily.  . fenofibrate 160 MG tablet Take 160 mg by mouth daily.  . folic acid (FOLVITE) 1 MG tablet Take 1 mg by mouth daily.  Marland Kitchen GARLIC PO Take by mouth.  . Glucosamine HCl (GLUCOSAMINE PO) Take by mouth.  Marland Kitchen HYDROcodone-acetaminophen (NORCO/VICODIN) 5-325 MG per tablet Take 1 tablet by mouth.  Marland Kitchen ibuprofen (ADVIL,MOTRIN) 200 MG tablet Take 200 mg by mouth every 6 (six) hours as needed.  . Magnesium 200 MG TABS Take by mouth.  . Melatonin 5 MG TABS Take 5 mg by mouth daily.  . meloxicam (MOBIC) 15 MG tablet Take 15 mg by mouth as needed.   . Multiple Vitamin (MULTIVITAMIN) tablet Take 1 tablet by mouth daily.  . Omega-3 Fatty Acids (FISH OIL PO) Take by mouth. Take 2 caps in morning and 1 caps in evening  . sildenafil (REVATIO) 20 MG tablet Take 20 mg by mouth as needed.  . simvastatin (ZOCOR) 20 MG tablet Take 20 mg by mouth daily.  . tamsulosin (FLOMAX) 0.4 MG CAPS capsule Take 0.4 mg by mouth daily after supper.  . TURMERIC PO Take 1 tablet by mouth.  . Zinc 50 MG TABS Take by mouth daily.    Allergies  Allergen Reactions  . Penicillins Rash    "many, many, many years ago"    Social History   Tobacco Use  . Smoking status: Never Smoker  . Smokeless tobacco: Never Used  Substance Use Topics  . Alcohol use: Yes  . Drug use: No    Social History   Social History Narrative   Married father of 2 with 1 grandchild. 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   He does not do any regular walking.  He is to use his home workout machine, but now is noted that he really hasn't had the same amount of time at the end of the day the use to. Using working from 6 AM to 7 PM.    family history includes Alzheimer's disease in his father; Diabetes in his brother; Heart disease in his brother; Heart disease (age of onset: 32) in his father; Hyperlipidemia in his mother.  Wt Readings from Last 3 Encounters:  08/18/17 190 lb 9.6 oz (86.5 kg)  08/13/16 209 lb (94.8 kg)  08/14/15 215 lb 6.4 oz (97.7 kg)    PHYSICAL EXAM BP (!) 142/64   Pulse 75   Ht 5\' 8"  (  1.727 m)   Wt 190 lb 9.6 oz (86.5 kg)   BMI 28.98 kg/m  Physical Exam  Constitutional: He is oriented to person, place, and time. He appears well-developed and well-nourished. No distress.  HENT:  Head: Normocephalic and atraumatic.  Neck: Normal range of motion. Neck supple. No hepatojugular reflux and no JVD present. Carotid bruit is not present.  Cardiovascular: Normal rate, regular rhythm and intact distal pulses. Exam reveals no gallop and no friction rub.  Murmur heard.  Harsh early systolic murmur is present with a grade of 1/6 at the upper right sternal border radiating to the neck. Pulmonary/Chest: Effort normal and breath sounds normal. No respiratory distress. He has no wheezes. He has no rales.  Abdominal: Bowel sounds are normal. He exhibits no distension. There is no tenderness. There is no rebound.  No HSM  Musculoskeletal: Normal range of motion. He exhibits no edema.  Neurological: He is alert and oriented to person, place, and time.  Psychiatric: He has a normal mood and affect. His behavior is normal. Judgment and thought content normal.  Nursing note and vitals reviewed.    Adult ECG Report  Rate: 75;  Rhythm: normal sinus rhythm and premature ventricular contractions (PVC); (interpolated PVC).  Otherwise normal axis, intervals and  durations.  Narrative Interpretation: Relatively normal EKG.   Other studies Reviewed: Additional studies/ records that were reviewed today include:  Recent Labs:  From Nov 2018 -- TC 122, TG 97, HDL 47, LDL  56; TSH 0.52.  BUS as creatinine 19/1.06. => Not due for another lab check until probably November.   ASSESSMENT / PLAN: Problem List Items Addressed This Visit    Obesity (BMI 30.0-34.9) (Chronic)    Continues to steadily lose weight with his diet and exercise.  He intends to continue losing anywhere from 15 to 20 pounds a year.  Goal is to reach the 160s. I congratulated him on his efforts and encouraged him to continue.      Frequent PVCs - Primary (Chronic)    Today - noted interpolated PVC on EKG - no Sx.   Monitor & expectant management.  Avoid Beta Blocker for now.      Relevant Orders   EKG 12-Lead (Completed)   Family history of cardiovascular disease (Chronic)    He is doing pretty much everything he can do to avoid his brother Spaete.  He has lost a lot of weight, and as a result his blood pressure and cholesterol levels are stable.  He is really not on that much in the way of any medications anymore.  I congratulated his efforts..  As such I will try to avoid using any additional medications.      Relevant Orders   EKG 12-Lead (Completed)   Dyslipidemia, goal LDL below 100 - on statin & fibrate (Chronic)    Most recent lipid panel shows LDL down to 56.  This is outstanding on fenofibrate and low-dose simvastatin.  Continue to monitor.  Triglycerides are also better with fenofibrate.          I spent a total of 25 minutes with the patient and chart review. >  50% of the time was spent in direct patient consultation.  He did quite a bit of time asking me questions about my thoughts on whether or not he should have any invasive procedures to treat his neck.  He asked about recommendations for referrals etc.  Acknowledging that these are not cardiac issues, he still  indicated that  he "values my judgment ".  Current medicines are reviewed at length with the patient today.  (+/- concerns) n/a The following changes have been made:  n/a  Patient Instructions  MEDICATION INSTRUCTIONS  NO CHANGES WITH  CURRENT  MEDICINES      Your physician wants you to follow-up in [place]. You will receive a reminder letter in the mail two months in advance. If you don't receive a letter, please call our office to schedule the follow-up appointment.   If you need a refill on your cardiac medications before your next appointment, please call your pharmacy.      Studies Ordered:   Orders Placed This Encounter  Procedures  . EKG 12-Lead      Glenetta Hew, M.D., M.S. Interventional Cardiologist   Pager # [phone] Phone # [phone] [address] [place], [place] [address]   Thank you for choosing [facility] at [facility]!!

## 2017-08-18 NOTE — Assessment & Plan Note (Signed)
Today - noted interpolated PVC on EKG - no Sx.   Monitor & expectant management.  Avoid Beta Blocker for now.

## 2017-08-18 NOTE — Patient Instructions (Signed)
MEDICATION INSTRUCTIONS  NO CHANGES WITH  CURRENT  MEDICINES      Your physician wants you to follow-up in [place]. You will receive a reminder letter in the mail two months in advance. If you don't receive a letter, please call our office to schedule the follow-up appointment.   If you need a refill on your cardiac medications before your next appointment, please call your pharmacy.

## 2017-08-19 ENCOUNTER — Encounter: Payer: Self-pay | Admitting: Cardiology

## 2017-08-19 NOTE — Assessment & Plan Note (Signed)
He is doing pretty much everything he can do to avoid his brother Spaete.  He has lost a lot of weight, and as a result his blood pressure and cholesterol levels are stable.  He is really not on that much in the way of any medications anymore.  I congratulated his efforts..  As such I will try to avoid using any additional medications.

## 2017-08-19 NOTE — Assessment & Plan Note (Signed)
Most recent lipid panel shows LDL down to 56.  This is outstanding on fenofibrate and low-dose simvastatin.  Continue to monitor.  Triglycerides are also better with fenofibrate.

## 2017-08-19 NOTE — Assessment & Plan Note (Signed)
Continues to steadily lose weight with his diet and exercise.  He intends to continue losing anywhere from 15 to 20 pounds a year.  Goal is to reach the 160s. I congratulated him on his efforts and encouraged him to continue.

## 2017-08-21 DIAGNOSIS — M5412 Radiculopathy, cervical region: Secondary | ICD-10-CM | POA: Diagnosis not present

## 2017-09-10 DIAGNOSIS — B351 Tinea unguium: Secondary | ICD-10-CM | POA: Diagnosis not present

## 2017-09-10 DIAGNOSIS — B353 Tinea pedis: Secondary | ICD-10-CM | POA: Diagnosis not present

## 2017-09-10 DIAGNOSIS — Z6830 Body mass index (BMI) 30.0-30.9, adult: Secondary | ICD-10-CM | POA: Diagnosis not present

## 2017-09-10 DIAGNOSIS — E6609 Other obesity due to excess calories: Secondary | ICD-10-CM | POA: Diagnosis not present

## 2017-09-10 DIAGNOSIS — Z1389 Encounter for screening for other disorder: Secondary | ICD-10-CM | POA: Diagnosis not present

## 2017-09-10 DIAGNOSIS — Z23 Encounter for immunization: Secondary | ICD-10-CM | POA: Diagnosis not present

## 2017-09-14 DIAGNOSIS — H269 Unspecified cataract: Secondary | ICD-10-CM | POA: Diagnosis not present

## 2017-09-14 DIAGNOSIS — H524 Presbyopia: Secondary | ICD-10-CM | POA: Diagnosis not present

## 2017-09-30 DIAGNOSIS — M5412 Radiculopathy, cervical region: Secondary | ICD-10-CM | POA: Diagnosis not present

## 2018-03-10 DIAGNOSIS — L7682 Other postprocedural complications of skin and subcutaneous tissue: Secondary | ICD-10-CM | POA: Diagnosis not present

## 2018-03-10 DIAGNOSIS — T8130XS Disruption of wound, unspecified, sequela: Secondary | ICD-10-CM | POA: Diagnosis not present

## 2018-03-15 DIAGNOSIS — E7849 Other hyperlipidemia: Secondary | ICD-10-CM | POA: Diagnosis not present

## 2018-03-15 DIAGNOSIS — G4730 Sleep apnea, unspecified: Secondary | ICD-10-CM | POA: Diagnosis not present

## 2018-03-15 DIAGNOSIS — N529 Male erectile dysfunction, unspecified: Secondary | ICD-10-CM | POA: Diagnosis not present

## 2018-03-15 DIAGNOSIS — E782 Mixed hyperlipidemia: Secondary | ICD-10-CM | POA: Diagnosis not present

## 2018-03-15 DIAGNOSIS — R9431 Abnormal electrocardiogram [ECG] [EKG]: Secondary | ICD-10-CM | POA: Diagnosis not present

## 2018-03-15 DIAGNOSIS — E6609 Other obesity due to excess calories: Secondary | ICD-10-CM | POA: Diagnosis not present

## 2018-03-15 DIAGNOSIS — Z6830 Body mass index (BMI) 30.0-30.9, adult: Secondary | ICD-10-CM | POA: Diagnosis not present

## 2018-03-15 DIAGNOSIS — Z23 Encounter for immunization: Secondary | ICD-10-CM | POA: Diagnosis not present

## 2018-03-15 DIAGNOSIS — J302 Other seasonal allergic rhinitis: Secondary | ICD-10-CM | POA: Diagnosis not present

## 2018-03-15 DIAGNOSIS — Z0001 Encounter for general adult medical examination with abnormal findings: Secondary | ICD-10-CM | POA: Diagnosis not present

## 2018-03-24 ENCOUNTER — Encounter: Payer: Self-pay | Admitting: Internal Medicine

## 2018-04-30 ENCOUNTER — Ambulatory Visit (AMBULATORY_SURGERY_CENTER): Payer: Self-pay | Admitting: *Deleted

## 2018-04-30 ENCOUNTER — Encounter: Payer: Self-pay | Admitting: Internal Medicine

## 2018-04-30 VITALS — Ht 69.0 in | Wt 197.0 lb

## 2018-04-30 DIAGNOSIS — Z1211 Encounter for screening for malignant neoplasm of colon: Secondary | ICD-10-CM

## 2018-04-30 MED ORDER — NA SULFATE-K SULFATE-MG SULF 17.5-3.13-1.6 GM/177ML PO SOLN: 1.0000 | Freq: Once | ORAL | 0 refills | Status: AC

## 2018-04-30 NOTE — Progress Notes (Signed)
No egg or soy allergy known to patient  No issues with past sedation with any surgeries  or procedures, no intubation problems  No diet pills per patient No home 02 use per patient  No blood thinners per patient  Pt denies issues with constipation  No A fib or A flutter  EMMI video sent to pt's e mail pt declined   

## 2018-05-14 ENCOUNTER — Ambulatory Visit (AMBULATORY_SURGERY_CENTER): Payer: Medicare HMO | Admitting: Internal Medicine

## 2018-05-14 ENCOUNTER — Encounter: Payer: Self-pay | Admitting: Internal Medicine

## 2018-05-14 VITALS — BP 119/71 | HR 63 | Temp 98.4°F | Resp 10 | Ht 68.0 in | Wt 190.0 lb

## 2018-05-14 DIAGNOSIS — D123 Benign neoplasm of transverse colon: Secondary | ICD-10-CM

## 2018-05-14 DIAGNOSIS — D122 Benign neoplasm of ascending colon: Secondary | ICD-10-CM

## 2018-05-14 DIAGNOSIS — Z1210 Encounter for screening for malignant neoplasm of intestinal tract, unspecified: Secondary | ICD-10-CM

## 2018-05-14 DIAGNOSIS — Z1211 Encounter for screening for malignant neoplasm of colon: Secondary | ICD-10-CM

## 2018-05-14 DIAGNOSIS — D120 Benign neoplasm of cecum: Secondary | ICD-10-CM | POA: Diagnosis not present

## 2018-05-14 DIAGNOSIS — E785 Hyperlipidemia, unspecified: Secondary | ICD-10-CM | POA: Diagnosis not present

## 2018-05-14 DIAGNOSIS — G4733 Obstructive sleep apnea (adult) (pediatric): Secondary | ICD-10-CM | POA: Diagnosis not present

## 2018-05-14 DIAGNOSIS — E669 Obesity, unspecified: Secondary | ICD-10-CM | POA: Diagnosis not present

## 2018-05-14 DIAGNOSIS — D125 Benign neoplasm of sigmoid colon: Secondary | ICD-10-CM

## 2018-05-14 MED ORDER — SODIUM CHLORIDE 0.9 % IV SOLN: 500.0000 mL | Freq: Once | INTRAVENOUS | Status: DC

## 2018-05-14 NOTE — Patient Instructions (Signed)
Discharge instructions given. Handouts on polyps and Hemorrhoids. Resume previous medications. YOU HAD AN ENDOSCOPIC PROCEDURE TODAY AT [facility]:   Refer to the procedure report that was given to you for any specific questions about what was found during the examination.  If the procedure report does not answer your questions, please call your gastroenterologist to clarify.  If you requested that your care partner not be given the details of your procedure findings, then the procedure report has been included in a sealed envelope for you to review at your convenience later.  YOU SHOULD EXPECT: Some feelings of bloating in the abdomen. Passage of more gas than usual.  Walking can help get rid of the air that was put into your GI tract during the procedure and reduce the bloating. If you had a lower endoscopy (such as a colonoscopy or flexible sigmoidoscopy) you may notice spotting of blood in your stool or on the toilet paper. If you underwent a bowel prep for your procedure, you may not have a normal bowel movement for a few days.  Please Note:  You might notice some irritation and congestion in your nose or some drainage.  This is from the oxygen used during your procedure.  There is no need for concern and it should clear up in a day or so.  SYMPTOMS TO REPORT IMMEDIATELY:   Following lower endoscopy (colonoscopy or flexible sigmoidoscopy):  Excessive amounts of blood in the stool  Significant tenderness or worsening of abdominal pains  Swelling of the abdomen that is new, acute  Fever of 100F or higher   For urgent or emergent issues, a gastroenterologist can be reached at any hour by calling [phone].   DIET:  We do recommend a small meal at first, but then you may proceed to your regular diet.  Drink plenty of fluids but you should avoid alcoholic beverages for 24 hours.  ACTIVITY:  You should plan to take it easy for the rest of today and you should NOT DRIVE  or use heavy machinery until tomorrow (because of the sedation medicines used during the test).    FOLLOW UP: Our staff will call the number listed on your records the next business day following your procedure to check on you and address any questions or concerns that you may have regarding the information given to you following your procedure. If we do not reach you, we will leave a message.  However, if you are feeling well and you are not experiencing any problems, there is no need to return our call.  We will assume that you have returned to your regular daily activities without incident.  If any biopsies were taken you will be contacted by phone or by letter within the next 1-3 weeks.  Please call us at [phone] if you have not heard about the biopsies in 3 weeks.    SIGNATURES/CONFIDENTIALITY: You and/or your care partner have signed paperwork which will be entered into your electronic medical record.  These signatures attest to the fact that that the information above on your After Visit Summary has been reviewed and is understood.  Full responsibility of the confidentiality of this discharge information lies with you and/or your care-partner.

## 2018-05-14 NOTE — Progress Notes (Signed)
Report to PACU, RN, vss, BBS= Clear.  

## 2018-05-14 NOTE — Op Note (Signed)
[name]: Charles [name] Procedure Date: 05/14/2018 9:26 AM MRN: [id number] Endoscopist: Jerene Bears , MD Age: 68 Referring MD:  Date of Birth: [birth year] Gender: Male Account #: [id number] Procedure:                Colonoscopy Indications:              Screening for colorectal malignant neoplasm, Last                            colonoscopy 10 years ago Medicines:                Monitored Anesthesia Care Procedure:                Pre-Anesthesia Assessment:                           - Prior to the procedure, a History and Physical                            was performed, and patient medications and                            allergies were reviewed. The patient's tolerance of                            previous anesthesia was also reviewed. The risks                            and benefits of the procedure and the sedation                            options and risks were discussed with the patient.                            All questions were answered, and informed consent                            was obtained. Prior Anticoagulants: The patient has                            taken no previous anticoagulant or antiplatelet                            agents. ASA Grade Assessment: II - A patient with                            mild systemic disease. After reviewing the risks                            and benefits, the patient was deemed in                            satisfactory condition to undergo the procedure.  After obtaining informed consent, the colonoscope                            was passed under direct vision. Throughout the                            procedure, the patient's blood pressure, pulse, and                            oxygen saturations were monitored continuously. The                            Colonoscope was introduced through the anus and                            advanced to the cecum, identified by  appendiceal                            orifice and ileocecal valve. The colonoscopy was                            performed without difficulty. The patient tolerated                            the procedure well. The quality of the bowel                            preparation was good. The ileocecal valve,                            appendiceal orifice, and rectum were photographed. Scope In: 9:33:40 AM Scope Out: 9:50:42 AM Scope Withdrawal Time: 0 hours 13 minutes 41 seconds  Total Procedure Duration: 0 hours 17 minutes 2 seconds  Findings:                 The digital rectal exam was normal.                           A 1 mm polyp was found in the cecum. The polyp was                            sessile. The polyp was removed with a cold biopsy                            forceps. Resection and retrieval were complete.                           A 4 mm polyp was found in the ascending colon. The                            polyp was sessile. The polyp was removed with a  cold snare. Resection and retrieval were complete.                           A 8 mm polyp was found in the sigmoid colon. The                            polyp was sessile. The polyp was removed with a                            cold snare. Resection and retrieval were complete.                           Two sessile polyps were found in the transverse                            colon. The polyps were 3 to 5 mm in size. These                            polyps were removed with a cold snare. Resection                            and retrieval were complete.                           Internal hemorrhoids were found during                            retroflexion. The hemorrhoids were small. Complications:            No immediate complications. Estimated Blood Loss:     Estimated blood loss was minimal. Impression:               - One 1 mm polyp in the cecum, removed with a cold                             biopsy forceps. Resected and retrieved.                           - One 4 mm polyp in the ascending colon, removed                            with a cold snare. Resected and retrieved.                           - One 8 mm polyp in the sigmoid colon, removed with                            a cold snare. Resected and retrieved.                           - Two 3 to 5 mm polyps in the transverse colon,  removed with a cold snare. Resected and retrieved.                           - Small internal hemorrhoids. Recommendation:           - Patient has a contact number available for                            emergencies. The signs and symptoms of potential                            delayed complications were discussed with the                            patient. Return to normal activities tomorrow.                            Written discharge instructions were provided to the                            patient.                           - Resume previous diet.                           - Continue present medications.                           - Await pathology results.                           - Repeat colonoscopy is recommended for                            surveillance. The colonoscopy date will be                            determined after pathology results from today's                            exam become available for review. Jerene Bears, MD 05/14/2018 9:55:12 AM This report has been signed electronically.

## 2018-05-14 NOTE — Progress Notes (Signed)
Called to room to assist during endoscopic procedure.  Patient ID and intended procedure confirmed with present staff. Received instructions for my participation in the procedure from the performing physician.  

## 2018-05-17 ENCOUNTER — Telehealth: Payer: Self-pay | Admitting: *Deleted

## 2018-05-17 NOTE — Telephone Encounter (Signed)
  Follow up Call-  Call back number 05/14/2018  Post procedure Call Back phone  # [phone]  Permission to leave phone message Yes  Some recent data might be hidden     Patient questions:  Do you have a fever, pain , or abdominal swelling? No. Pain Score  0 *  Have you tolerated food without any problems? Yes.    Have you been able to return to your normal activities? Yes.    Do you have any questions about your discharge instructions: Diet   No. Medications  No. Follow up visit  No.  Do you have questions or concerns about your Care? No.  Actions: * If pain score is 4 or above: No action needed, pain <4.

## 2018-05-19 ENCOUNTER — Encounter: Payer: Self-pay | Admitting: Internal Medicine

## 2018-05-28 ENCOUNTER — Telehealth: Payer: Self-pay | Admitting: Internal Medicine

## 2018-05-28 DIAGNOSIS — N400 Enlarged prostate without lower urinary tract symptoms: Secondary | ICD-10-CM | POA: Diagnosis not present

## 2018-05-28 DIAGNOSIS — R1084 Generalized abdominal pain: Secondary | ICD-10-CM | POA: Diagnosis not present

## 2018-05-28 DIAGNOSIS — Z880 Allergy status to penicillin: Secondary | ICD-10-CM | POA: Diagnosis not present

## 2018-05-28 DIAGNOSIS — R1030 Lower abdominal pain, unspecified: Secondary | ICD-10-CM | POA: Diagnosis not present

## 2018-05-28 DIAGNOSIS — R197 Diarrhea, unspecified: Secondary | ICD-10-CM | POA: Diagnosis not present

## 2018-05-28 NOTE — Telephone Encounter (Signed)
Monday lower abdominal cramping pain and loose stools Thought viral -- up all night Mon into Tuesday Tuesday ok, though again Tues night Wed weak, Thursday felt okay Went to [place] on a trip.  Ate sub sandwich, started again Imodium, kaopectate, dicyclomine given Stools dark after kaopectate, no red blood or hematochezia No known fever, feels sick, no vomiting No flu like symptoms  I recommended he be seen in local urgent care or ER Sounds infectious, but with 4+ days of diarrhea, CBC, CMP and stool studies are indicated  Time provided for questions and answers and she thanked me for the call

## 2018-05-28 NOTE — Telephone Encounter (Signed)
Called back after receiving a telephone call from answering service. No answer on cell or home number. I left messages on both She can reach me again via the office number

## 2018-05-31 NOTE — Telephone Encounter (Signed)
Patient scheduled with Tye Savoy RNP on 06/02/18 2:00

## 2018-05-31 NOTE — Telephone Encounter (Signed)
PT went to the ER like DR. Pyrtle advise. Pt is wondering if he should come in for a follow up since they did not do a stool study. Does feel a little better but was just concerned about the bowel movement.

## 2018-06-02 ENCOUNTER — Other Ambulatory Visit: Payer: Medicare HMO

## 2018-06-02 ENCOUNTER — Ambulatory Visit: Payer: Medicare HMO | Admitting: Nurse Practitioner

## 2018-06-02 ENCOUNTER — Encounter: Payer: Self-pay | Admitting: Nurse Practitioner

## 2018-06-02 VITALS — BP 164/84 | HR 78 | Ht 68.0 in | Wt 198.0 lb

## 2018-06-02 DIAGNOSIS — R197 Diarrhea, unspecified: Secondary | ICD-10-CM | POA: Diagnosis not present

## 2018-06-02 NOTE — Patient Instructions (Signed)
If you are age 66 or older, your body mass index should be between 23-30. Your Body mass index is 30.11 kg/m. If this is out of the aforementioned range listed, please consider follow up with your Primary Care Provider.  If you are age 69 or younger, your body mass index should be between 19-25. Your Body mass index is 30.11 kg/m. If this is out of the aformentioned range listed, please consider follow up with your Primary Care Provider.   Your provider has requested that you go to the basement level for lab work before leaving today. Press "B" on the elevator. The lab is located at the first door on the left as you exit the elevator.  We will call you with results.  Thank you for choosing me and [facility].   Tye Savoy, NP

## 2018-06-02 NOTE — Progress Notes (Addendum)
Chief Complaint:    diarrhea  IMPRESSION and PLAN:    57. 68 year old male with acute diarrheal illness, resolving. This was most likely an infectious process.  Evaluated by urgent care in [place].  Per patient, labs and CT scan unremarkable.  Stool studies were not done -I expect that he will continue to improve -Not requiring much Lomotil now, I recommend he discontinue it altogether to evaluate bowel movements. I don't expect an immediate return of normal bowel movements but certainly no flagrant diarrhea.  Patient lives out of town. He should have stool specimen on hand should diarrhea recur.  I will place orders for stool studies so that he can get cups from lab.   2.  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  Due for surveillance colonoscopy January 2023  Addendum: Reviewed and agree with assessment and management plan. Pyrtle, Lajuan Lines, MD    HPI:     Patient is a 68 year old male who underwent screening colonoscopy with polypectomy (adenomas)  05/14/18.  On 05/28/2018 patient called the office with a four day history of lower abdominal cramping and loose, non-bloody stools.  He had no associated nausea/vomiting. No fevers.  No contact with others with similar symptoms.  No recent antibiotics . When patient called he was on vacation in [place] , [place] .   We recommended evaluation at a local urgent care center.  Sounds like labs there were unremarkable.  Per patient CT scan also unremarkable. No stool studies were done.  Patient was prescribed Lomotil. He is here for follow up  Charles [name] has improved.  He took a Lomotil this morning but prior to that his last one was on Monday.  He woke early this a.m. with some cramping and loose, but not diarrheal stool.  A few hours later he had another small loose bowel movement.  He took Lomotil to be able to get to this appointment without having another BM.    Review of systems:     No chest pain, no SOB, no fevers, no urinary sx    Past Medical History:  Diagnosis Date  . Allergy   . Hyperlipidemia    well controlled  . PONV (postoperative nausea and vomiting)   . Sleep apnea    had surgery to correct - no cpap   . Sleep apnea, central 05/31/2009   NOCTURNAL POLYSOMNOGRAM -(DR Constance Holster, ENT) MILD SLEEP APNEA SYNDROME ,MILD HYPOVENTILATION ; ABN SLEEP ARCHITECTURE WITH POOR SLEEP EFFICIENCY AND REDUCED SLOW WAVE SLEEP RESULTS AN INCREASESD STAGE 2 SLEEP. ;; REC 2 L QHS O2 - No longer on CPAP - Had ENT surgery in 2008  . Umbilical hernia 06/28/2023   ASYMPTOMATIC WITH NO GI  SYMPTOMS  . Ventral hernia 11/30/2012   ASYMPTOMATIC    Patient's surgical history, family medical history, social history, medications and allergies were all reviewed in Epic   Creatinine clearance cannot be calculated (Patient's most recent lab result is older than the maximum 21 days allowed.)  Current Outpatient Medications  Medication Sig Dispense Refill  . Ascorbic Acid (VITAMIN C PO) Take 1,000 mg by mouth.    Marland Kitchen aspirin EC 81 MG tablet Take 81 mg by mouth daily.    Marland Kitchen BIOTIN PO Take 1 tablet by mouth as needed.    . Calcium Carbonate-Vit D-Min (CALCIUM 1200 PO) Take by mouth.    . Cholecalciferol (VITAMIN D3) 400 UNITS CAPS Take by mouth.    . Coenzyme Q10 (CO Q 10 PO)  Take 30 mg by mouth daily.    . Cyanocobalamin (VITAMIN B 12 PO) Take 1,000 mg by mouth daily.    . fenofibrate 160 MG tablet Take 160 mg by mouth daily.    . folic acid (FOLVITE) 1 MG tablet Take 1 mg by mouth daily.    Marland Kitchen GARLIC PO Take by mouth.    . Glucosamine HCl (GLUCOSAMINE PO) Take by mouth.    Marland Kitchen HYDROcodone-acetaminophen (NORCO/VICODIN) 5-325 MG per tablet Take 1 tablet by mouth.  0  . ibuprofen (ADVIL,MOTRIN) 200 MG tablet Take 200 mg by mouth every 6 (six) hours as needed.    . Magnesium 200 MG TABS Take by mouth.    . Melatonin 5 MG TABS Take 5 mg by mouth daily.    . meloxicam (MOBIC) 15 MG tablet Take 15 mg by mouth as needed.   0  . Multiple Vitamin  (MULTIVITAMIN) tablet Take 1 tablet by mouth daily.    . Omega-3 Fatty Acids (FISH OIL PO) Take by mouth. Take 2 caps in morning and 1 caps in evening    . Psyllium (KONSYL DAILY FIBER PO) Take by mouth 2 (two) times daily after a meal.    . sildenafil (REVATIO) 20 MG tablet Take 20 mg by mouth as needed.    . simvastatin (ZOCOR) 20 MG tablet Take 20 mg by mouth daily.    . tamsulosin (FLOMAX) 0.4 MG CAPS capsule Take 0.4 mg by mouth daily after supper.    . TURMERIC PO Take 1 tablet by mouth.    . Zinc 50 MG TABS Take by mouth daily.     No current facility-administered medications for this visit.     Physical Exam:     BP (!) 164/84   Pulse 78   Ht 5\' 8"  (1.727 m)   Wt 198 lb (89.8 kg)   BMI 30.11 kg/m   GENERAL:  Pleasant male in NAD PSYCH: : Cooperative, normal affect EENT:  conjunctiva pink, mucous membranes moist, neck supple without masses CARDIAC:  RRR, no murmur heard, no peripheral edema PULM: Normal respiratory effort, lungs CTA bilaterally, no wheezing ABDOMEN:  Nondistended, soft, nontender. No obvious masses, no hepatomegaly,  normal bowel sounds SKIN:  turgor, no lesions seen Musculoskeletal:  Normal muscle tone, normal strength NEURO: Alert and oriented x 3, no focal neurologic deficits   Tye Savoy , NP 06/02/2018, 1:58 PM

## 2018-08-11 ENCOUNTER — Telehealth: Payer: Self-pay | Admitting: Cardiology

## 2018-08-11 NOTE — Telephone Encounter (Signed)
  Patient is returning call regarding his appt on 08/18/18

## 2018-08-11 NOTE — Telephone Encounter (Signed)
  Please call him on his cell number

## 2018-08-11 NOTE — Telephone Encounter (Signed)
Spoke with patient. Patient decide to reschedule visit to sept 2020  Due to covid protocol

## 2018-08-18 ENCOUNTER — Ambulatory Visit: Payer: Medicare HMO | Admitting: Cardiology

## 2019-01-10 DIAGNOSIS — J302 Other seasonal allergic rhinitis: Secondary | ICD-10-CM | POA: Diagnosis not present

## 2019-01-10 DIAGNOSIS — E7849 Other hyperlipidemia: Secondary | ICD-10-CM | POA: Diagnosis not present

## 2019-01-10 DIAGNOSIS — R9431 Abnormal electrocardiogram [ECG] [EKG]: Secondary | ICD-10-CM | POA: Diagnosis not present

## 2019-01-10 DIAGNOSIS — Z23 Encounter for immunization: Secondary | ICD-10-CM | POA: Diagnosis not present

## 2019-01-10 DIAGNOSIS — Z0001 Encounter for general adult medical examination with abnormal findings: Secondary | ICD-10-CM | POA: Diagnosis not present

## 2019-01-10 DIAGNOSIS — Z6831 Body mass index (BMI) 31.0-31.9, adult: Secondary | ICD-10-CM | POA: Diagnosis not present

## 2019-01-10 DIAGNOSIS — Z125 Encounter for screening for malignant neoplasm of prostate: Secondary | ICD-10-CM | POA: Diagnosis not present

## 2019-01-10 DIAGNOSIS — G4730 Sleep apnea, unspecified: Secondary | ICD-10-CM | POA: Diagnosis not present

## 2019-01-10 DIAGNOSIS — Z1389 Encounter for screening for other disorder: Secondary | ICD-10-CM | POA: Diagnosis not present

## 2019-01-14 ENCOUNTER — Other Ambulatory Visit: Payer: Self-pay

## 2019-01-14 ENCOUNTER — Encounter: Payer: Self-pay | Admitting: Cardiology

## 2019-01-14 ENCOUNTER — Ambulatory Visit (INDEPENDENT_AMBULATORY_CARE_PROVIDER_SITE_OTHER): Payer: Medicare HMO | Admitting: Cardiology

## 2019-01-14 VITALS — BP 131/68 | HR 76 | Temp 97.9°F | Ht 68.0 in | Wt 198.0 lb

## 2019-01-14 DIAGNOSIS — Z8249 Family history of ischemic heart disease and other diseases of the circulatory system: Secondary | ICD-10-CM

## 2019-01-14 DIAGNOSIS — I493 Ventricular premature depolarization: Secondary | ICD-10-CM | POA: Diagnosis not present

## 2019-01-14 DIAGNOSIS — E669 Obesity, unspecified: Secondary | ICD-10-CM

## 2019-01-14 DIAGNOSIS — E785 Hyperlipidemia, unspecified: Secondary | ICD-10-CM | POA: Diagnosis not present

## 2019-01-14 NOTE — Patient Instructions (Signed)
Medication Instructions:  NO CHANGES    Lab work: NOT NEEDED  Testing/Procedures:  CT coronary calcium score. This test is done at [address]. This is $150 out of pocket.   Coronary CalciumScan A coronary calcium scan is an imaging test used to look for deposits of calcium and other fatty materials (plaques) in the inner lining of the blood vessels of the heart (coronary arteries). These deposits of calcium and plaques can partly clog and narrow the coronary arteries without producing any symptoms or warning signs. This puts a person at risk for a heart attack. This test can detect these deposits before symptoms develop. Tell a health care provider about:  Any allergies you have.  All medicines you are taking, including vitamins, herbs, eye drops, creams, and over-the-counter medicines.  Any problems you or family members have had with anesthetic medicines.  Any blood disorders you have.  Any surgeries you have had.  Any medical conditions you have.  Whether you are pregnant or may be pregnant. What are the risks? Generally, this is a safe procedure. However, problems may occur, including:  Harm to a pregnant woman and her unborn baby. This test involves the use of radiation. Radiation exposure can be dangerous to a pregnant woman and her unborn baby. If you are pregnant, you generally should not have this procedure done.  Slight increase in the risk of cancer. This is because of the radiation involved in the test. What happens before the procedure? No preparation is needed for this procedure. What happens during the procedure?  You will undress and remove any jewelry around your neck or chest.  You will put on a hospital gown.  Sticky electrodes will be placed on your chest. The electrodes will be connected to an electrocardiogram (ECG) machine to record a tracing of the electrical activity of your heart.  A CT scanner will take pictures of your  heart. During this time, you will be asked to lie still and hold your breath for 2-3 seconds while a picture of your heart is being taken. The procedure may vary among health care providers and hospitals. What happens after the procedure?  You can get dressed.  You can return to your normal activities.  It is up to you to get the results of your test. Ask your health care provider, or the department that is doing the test, when your results will be ready. Summary  A coronary calcium scan is an imaging test used to look for deposits of calcium and other fatty materials (plaques) in the inner lining of the blood vessels of the heart (coronary arteries).  Generally, this is a safe procedure. Tell your health care provider if you are pregnant or may be pregnant.  No preparation is needed for this procedure.  A CT scanner will take pictures of your heart.  You can return to your normal activities after the scan is done. This information is not intended to replace advice given to you by your health care provider. Make sure you discuss any questions you have with your health care provider. Document Released: 10/11/2007 Document Revised: 03/03/2016 Document Reviewed: 03/03/2016 Elsevier Interactive Patient Education  2017 [place]: At [facility], you and your health needs are our priority.  As part of our continuing mission to provide you with exceptional heart care, we have created designated Provider Care Teams.  These Care Teams include your primary Cardiologist (physician) and Advanced Practice Providers (APPs -  Physician Assistants and Nurse Practitioners) who all work together to provide you with the care you need, when you need it. . You will need a follow up appointment in 12  Months-SEPT 2021.  Please call our office 2 months in advance to schedule this appointment.  You may see Glenetta Hew, MD or one of the following Advanced Practice Providers on your designated  Care Team:   . Rosaria Ferries, PA-C . Jory Sims, DNP, ANP  Any Other Special Instructions Will Be Listed Below (If Applicable).

## 2019-01-14 NOTE — Progress Notes (Signed)
PCP: Sharilyn Sites, MD  Clinic Note: Chief Complaint  Patient presents with   Follow-up    Doing well.  No active symptoms   Hypertension    Controlled   Hyperlipidemia    Controlled    HPI: Charles [name] is a 68 y.o. male with a PMH notable for cardiac risk factors of obesity, hypertension and hyperlipidemia with significant family history of CAD who presents here today for delayed annual follow-up.    Family history of CAD (father had an MI in his 43s, but his brother had multivessel disease with CABG at 79 died at age 58 -but he had diabetes).  Charles [name] was last seen on August 13, 2017-doing well with no cardiac symptoms.  Staying active.  Monitoring his activity with his FITBIT.  Try to do roughly 10,000 steps a day.  No chest pain etc. was also working on [employer] in addition to doing stationary bicycle, treadmill or walking.  Was try to get weight down to the 160 pounds rate range. --> Stable, no changes  Recent Hospitalizations: None  Studies Personally Reviewed - (if available, images/films reviewed: From Epic Chart or Care Everywhere)  None  Interval History: Charles [name] returns today still doing well - enjoying retirement.  Unfortunately, he is not doing as well with weight loss.  Partly because of issues with not being able to go to the gym and does lack of motivation during the COVID-19 lockdown, he has put back on about 10 pounds. Now that the weather is getting better he is actually doing more.  He is staying active but not doing his good job doing the routine exercise that he was doing with treadmill and stationary bicycle.  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  He does some ad hoc metal art and has started back keeping his bees.  All this activity does keep him pretty busy and is not really having any significant symptoms.  He is just a little bit upset that he is gained weight  back.  He occasionally has some palpitations but nothing more than a couple fleeting seconds here and there.  No rapid irregular heartbeats.   Cardiovascular review of symptoms: Cardiovascular ROS: no chest pain or dyspnea on exertion positive for - palpitations negative for - edema, orthopnea, paroxysmal nocturnal dyspnea, rapid heart rate, shortness of breath or Syncope/near syncope, TIA/amaurosis fugax, claudication.  ROS: A comprehensive was performed. Review of Systems  Constitutional: Negative for chills, fever, malaise/fatigue and weight loss (gained back some wgt ).  HENT: Negative for congestion and nosebleeds.   Respiratory: Negative for shortness of breath.   Gastrointestinal: Negative for abdominal pain, blood in stool, heartburn and melena.  Genitourinary: Negative for hematuria.       Nocturia still,  Musculoskeletal: Positive for joint pain (Knee pain - off & on) and neck pain (manageble but minimal pain since last injection ~ 1 yr ago). Negative for myalgias.  Neurological: Negative for dizziness and headaches.  Endo/Heme/Allergies: Negative for environmental allergies (Eats local honey that he makes.).  Psychiatric/Behavioral: Negative for memory loss. The patient is not nervous/anxious and does not have insomnia.   All other systems reviewed and are negative.   The patient does not have symptoms concerning for COVID-19 infection (fever, chills, cough, or new shortness of breath).  The patient is practicing social distancing.   COVID-19 Education: The signs and symptoms of COVID-19  were discussed with the patient and how to seek care for testing (follow up with PCP or arrange E-visit).   The importance of social distancing was discussed today.   I have reviewed and (if needed) personally updated the patient's problem list, medications, allergies, past medical and surgical history, social and family history.   Past Medical History:  Diagnosis Date   Allergy     Hyperlipidemia    well controlled   PONV (postoperative nausea and vomiting)    Sleep apnea    had surgery to correct - no cpap    Sleep apnea, central 05/31/2009   NOCTURNAL POLYSOMNOGRAM -(DR Constance Holster, ENT) MILD SLEEP APNEA SYNDROME ,MILD HYPOVENTILATION ; ABN SLEEP ARCHITECTURE WITH POOR SLEEP EFFICIENCY AND REDUCED SLOW WAVE SLEEP RESULTS AN INCREASESD STAGE 2 SLEEP. ;; REC 2 L QHS O2 - No longer on CPAP - Had ENT surgery in [id number]   Umbilical hernia [id number]   ASYMPTOMATIC WITH NO GI  SYMPTOMS   Ventral hernia 11/30/2012   ASYMPTOMATIC    Past Surgical History:  Procedure Laterality Date   CARPAL TUNNEL RELEASE Bilateral    COLONOSCOPY     x 2- normal    DOPPLER ECHOCARDIOGRAPHY  09/08/2011   EF [id number] ,LV SYSTOLIC FUNCTION NORMAL    FOOT SURGERY Right 1985   LUMBAR LAMINECTOMY  1970   NASAL SEPTUM SURGERY  1970   SHOULDER SURGERY Bilateral    sleep apnea surgery     removal of soft palate, septoplasty #2, tonsillectomy    TONSILLECTOMY  10/11/2008   DR Constance Holster-- with uvulopalatophrayngoplasty,revision nasal septolasty   TREADMILL EXERCISE STRESS TEST  01/01/2012; 07/2013   a) 9/'13:NORMOTENSIVE REPONSE TO EXERCISE; GOOD EXERCISE TOLERANCE;NO ECG evidence of exercise-induced ischemia; rare PVCs;; b) 4/'15: Non-ischemic, EF [id number]   UMBILICAL HERNIA REPAIR      Current Meds  Medication Sig   Ascorbic Acid (VITAMIN C PO) Take 1,000 mg by mouth.   aspirin EC 81 MG tablet Take 81 mg by mouth daily.   BIOTIN PO Take 1 tablet by mouth as needed.   Calcium Carbonate-Vit D-Min (CALCIUM 1200 PO) Take by mouth.   Cholecalciferol (VITAMIN D3) 400 UNITS CAPS Take by mouth.   Coenzyme Q10 (CO Q 10 PO) Take 30 mg by mouth daily.   Cyanocobalamin (VITAMIN B 12 PO) Take 1,000 mg by mouth daily.   fenofibrate 160 MG tablet Take 160 mg by mouth daily.   folic acid (FOLVITE) 1 MG tablet Take 1 mg by mouth daily.   GARLIC PO Take by mouth.   Glucosamine HCl (GLUCOSAMINE PO) Take  by mouth.   HYDROcodone-acetaminophen (NORCO/VICODIN) 5-325 MG per tablet Take 1 tablet by mouth.   ibuprofen (ADVIL,MOTRIN) 200 MG tablet Take 200 mg by mouth every 6 (six) hours as needed.   Magnesium 200 MG TABS Take by mouth.   Melatonin 5 MG TABS Take 5 mg by mouth daily.   meloxicam (MOBIC) 15 MG tablet Take 15 mg by mouth as needed.    Multiple Vitamin (MULTIVITAMIN) tablet Take 1 tablet by mouth daily.   Omega-3 Fatty Acids (FISH OIL PO) Take by mouth. Take 2 caps in morning and 1 caps in evening   Psyllium (KONSYL DAILY FIBER PO) Take by mouth 2 (two) times daily after a meal.   sildenafil (REVATIO) 20 MG tablet Take 20 mg by mouth as needed.   simvastatin (ZOCOR) 20 MG tablet Take 20 mg by mouth daily.   tamsulosin (FLOMAX) 0.4 MG CAPS capsule Take 0.4  mg by mouth daily after supper.   TURMERIC PO Take 1 tablet by mouth.   Zinc 50 MG TABS Take by mouth daily.    Allergies  Allergen Reactions   Penicillins Rash    "many, many, many years ago"    Social History   Tobacco Use   Smoking status: Never Smoker   Smokeless tobacco: Never Used  Substance Use Topics   Alcohol use: Yes    Comment: occasionally    Drug use: No    Social History   Social History Narrative   Married father of 2 with 1 grandchild. 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   He does not do any regular walking.  He is to use his home workout machine, but now is noted that he really hasn't had the same amount of time at the end of the day the use to. Using working from 6 AM to 7 PM.    family history includes Alzheimer's disease in his father; Colon cancer in his maternal uncle and another family member; Diabetes in his brother; Heart disease in his brother; Heart disease (age of onset: 67) in his father;  Hyperlipidemia in his mother.  Wt Readings from Last 3 Encounters:  01/14/19 198 lb (89.8 kg)  06/02/18 198 lb (89.8 kg)  05/14/18 190 lb (86.2 kg)  -- has gained back some of the wgt he had lost.   PHYSICAL EXAM BP 131/68    Pulse 76    Temp 97.9 F (36.6 C)    Ht 5\' 8"  (1.727 m)    Wt 198 lb (89.8 kg)    SpO2 97%    BMI 30.11 kg/m  Physical Exam  Constitutional: He is oriented to person, place, and time. He appears well-developed and well-nourished. No distress.  HENT:  Head: Normocephalic and atraumatic.  Neck: Normal range of motion. Neck supple. No hepatojugular reflux and no JVD present. Carotid bruit is not present.  Cardiovascular: Normal rate, regular rhythm, S1 normal, S2 normal and intact distal pulses.  Occasional extrasystoles are present. PMI is not displaced. Exam reveals no gallop and no friction rub.  Murmur heard.  Harsh crescendo-decrescendo early systolic murmur is present with a grade of 1/6 at the upper right sternal border. Pulmonary/Chest: Effort normal and breath sounds normal. No respiratory distress. He has no wheezes. He has no rales.  Abdominal: Bowel sounds are normal. He exhibits no distension. There is no abdominal tenderness. There is no rebound.  No HSM  Musculoskeletal: Normal range of motion.        General: No edema.  Neurological: He is alert and oriented to person, place, and time.  Psychiatric: He has a normal mood and affect. His behavior is normal. Judgment and thought content normal.  Nursing note and vitals reviewed.    Adult ECG Report -Not checked  Other studies Reviewed: Additional studies/ records that were reviewed today include:  Recent Labs:  From 01/10/2019-- TC 126, TG 136, HDL 42, LDL 60; Hgb 15.4.  TSH 0.69.   ASSESSMENT / PLAN: Problem List Items Addressed This Visit    Frequent PVCs (Chronic)    Relatively asymptomatic.  Not the main exam today.  If he were to needed medication for blood pressure, would consider  long-acting beta-blocker such as Toprol or nadolol.      Relevant Orders   CT CARDIAC SCORING   Dyslipidemia, goal LDL below 100 - on statin & fibrate (Chronic)    LDL still looks great on modest dose of simvastatin.  Doing well.      Obesity (BMI 30.0-34.9) (Chronic)    He had done really well with weight loss, but took a turn for the worse this past year with COVID-19 restrictions.  Hopefully as things get back in gear.  He will regain motivation and get back and exercising.  Also needs to continue monitoring his diet.      Family history of cardiovascular disease - Primary (Chronic)    Pretty significant family history and also borderline criteria for metabolic syndrome.  With obesity, borderline pressures requiring statin for dyslipidemia, we talked about at baseline ischemic evaluation.  Has had a stress test in the past that were negative, but that would tell us his baseline risk of cardiac disease. Plan: We will check coronary calcium score as a baseline screen and then go from there.  If abnormal will consider further testing such as coronary CT angiogram.      Relevant Orders   CT CARDIAC SCORING       I spent a total of 25 minutes with the patient and chart review. >  50% of the time was spent in direct patient consultation.   Current medicines are reviewed at length with the patient today.  (+/- concerns) n/a The following changes have been made:  n/a  Patient Instructions  Medication Instructions:  NO CHANGES    Lab work: NOT NEEDED  Testing/Procedures:  CT coronary calcium score. This test is done at [address]. This is $150 out of pocket.   Coronary CalciumScan A coronary calcium scan is an imaging test used to look for deposits of calcium and other fatty materials (plaques) in the inner lining of the blood vessels of the heart (coronary arteries). These deposits of calcium and plaques can partly clog and narrow the coronary arteries  without producing any symptoms or warning signs. This puts a person at risk for a heart attack. This test can detect these deposits before symptoms develop. Tell a health care provider about:  Any allergies you have.  All medicines you are taking, including vitamins, herbs, eye drops, creams, and over-the-counter medicines.  Any problems you or family members have had with anesthetic medicines.  Any blood disorders you have.  Any surgeries you have had.  Any medical conditions you have.  Whether you are pregnant or may be pregnant. What are the risks? Generally, this is a safe procedure. However, problems may occur, including:  Harm to a pregnant woman and her unborn baby. This test involves the use of radiation. Radiation exposure can be dangerous to a pregnant woman and her unborn baby. If you are pregnant, you generally should not have this procedure done.  Slight increase in the risk of cancer. This is because of the radiation involved in the test. What happens before the procedure? No preparation is needed for this procedure. What happens during the procedure?  You will undress and remove any jewelry around your neck or chest.  You will put on a hospital gown.  Sticky electrodes will be placed on your chest. The electrodes will be connected to an electrocardiogram (ECG) machine to record a tracing of the electrical activity of your heart.  A CT scanner will take pictures  of your heart. During this time, you will be asked to lie still and hold your breath for 2-3 seconds while a picture of your heart is being taken. The procedure may vary among health care providers and hospitals. What happens after the procedure?  You can get dressed.  You can return to your normal activities.  It is up to you to get the results of your test. Ask your health care provider, or the department that is doing the test, when your results will be ready. Summary  A coronary calcium scan is an  imaging test used to look for deposits of calcium and other fatty materials (plaques) in the inner lining of the blood vessels of the heart (coronary arteries).  Generally, this is a safe procedure. Tell your health care provider if you are pregnant or may be pregnant.  No preparation is needed for this procedure.  A CT scanner will take pictures of your heart.  You can return to your normal activities after the scan is done. This information is not intended to replace advice given to you by your health care provider. Make sure you discuss any questions you have with your health care provider. Document Released: 10/11/2007 Document Revised: 03/03/2016 Document Reviewed: 03/03/2016 Elsevier Interactive Patient Education  2017 [place]: At [facility], you and your health needs are our priority.  As part of our continuing mission to provide you with exceptional heart care, we have created designated Provider Care Teams.  These Care Teams include your primary Cardiologist (physician) and Advanced Practice Providers (APPs -  Physician Assistants and Nurse Practitioners) who all work together to provide you with the care you need, when you need it.  You will need a follow up appointment in 12  Months-SEPT 2021.  Please call our office 2 months in advance to schedule this appointment.  You may see Glenetta Hew, MD or one of the following Advanced Practice Providers on your designated Care Team:    Rosaria Ferries, PA-C  Jory Sims, DNP, ANP  Any Other Special Instructions Will Be Listed Below (If Applicable).     Studies Ordered:   Orders Placed This Encounter  Procedures   CT CARDIAC SCORING      Glenetta Hew, M.D., M.S. Interventional Cardiologist   Pager # [phone] Phone # [phone] [address] [place], [place] [address]   Thank you for choosing Heartcare at [facility]!!

## 2019-01-16 ENCOUNTER — Encounter: Payer: Self-pay | Admitting: Cardiology

## 2019-01-16 NOTE — Assessment & Plan Note (Signed)
He had done really well with weight loss, but took a turn for the worse this past year with COVID-19 restrictions.  Hopefully as things get back in gear.  He will regain motivation and get back and exercising.  Also needs to continue monitoring his diet.

## 2019-01-16 NOTE — Assessment & Plan Note (Signed)
Relatively asymptomatic.  Not the main exam today.  If he were to needed medication for blood pressure, would consider long-acting beta-blocker such as Toprol or nadolol.

## 2019-01-16 NOTE — Assessment & Plan Note (Signed)
Pretty significant family history and also borderline criteria for metabolic syndrome.  With obesity, borderline pressures requiring statin for dyslipidemia, we talked about at baseline ischemic evaluation.  Has had a stress test in the past that were negative, but that would tell us his baseline risk of cardiac disease. Plan: We will check coronary calcium score as a baseline screen and then go from there.  If abnormal will consider further testing such as coronary CT angiogram.

## 2019-01-16 NOTE — Assessment & Plan Note (Signed)
LDL still looks great on modest dose of simvastatin.  Doing well.

## 2019-02-22 ENCOUNTER — Other Ambulatory Visit: Payer: Self-pay

## 2019-02-22 ENCOUNTER — Ambulatory Visit (INDEPENDENT_AMBULATORY_CARE_PROVIDER_SITE_OTHER)
Admission: RE | Admit: 2019-02-22 | Discharge: 2019-02-22 | Disposition: A | Discharge status: Home / Self Care | Payer: Medicare HMO | Source: Ambulatory Visit | Attending: Cardiology | Admitting: Cardiology

## 2019-02-22 DIAGNOSIS — Z8249 Family history of ischemic heart disease and other diseases of the circulatory system: Secondary | ICD-10-CM

## 2019-02-22 DIAGNOSIS — I493 Ventricular premature depolarization: Secondary | ICD-10-CM

## 2019-03-28 DIAGNOSIS — E7849 Other hyperlipidemia: Secondary | ICD-10-CM | POA: Diagnosis not present

## 2019-03-28 DIAGNOSIS — M5030 Other cervical disc degeneration, unspecified cervical region: Secondary | ICD-10-CM | POA: Diagnosis not present

## 2019-04-28 DIAGNOSIS — M5030 Other cervical disc degeneration, unspecified cervical region: Secondary | ICD-10-CM | POA: Diagnosis not present

## 2019-04-28 DIAGNOSIS — E7849 Other hyperlipidemia: Secondary | ICD-10-CM | POA: Diagnosis not present

## 2019-06-26 DIAGNOSIS — M5030 Other cervical disc degeneration, unspecified cervical region: Secondary | ICD-10-CM | POA: Diagnosis not present

## 2019-06-26 DIAGNOSIS — E7849 Other hyperlipidemia: Secondary | ICD-10-CM | POA: Diagnosis not present

## 2019-08-26 DIAGNOSIS — E7849 Other hyperlipidemia: Secondary | ICD-10-CM | POA: Diagnosis not present

## 2019-08-26 DIAGNOSIS — M5030 Other cervical disc degeneration, unspecified cervical region: Secondary | ICD-10-CM | POA: Diagnosis not present

## 2019-11-25 DIAGNOSIS — E7849 Other hyperlipidemia: Secondary | ICD-10-CM | POA: Diagnosis not present

## 2019-11-25 DIAGNOSIS — M5030 Other cervical disc degeneration, unspecified cervical region: Secondary | ICD-10-CM | POA: Diagnosis not present

## 2020-01-16 ENCOUNTER — Ambulatory Visit: Payer: Medicare HMO | Admitting: Cardiology

## 2020-01-16 ENCOUNTER — Other Ambulatory Visit: Payer: Self-pay

## 2020-01-16 ENCOUNTER — Encounter: Payer: Self-pay | Admitting: Cardiology

## 2020-01-16 VITALS — BP 128/70 | HR 71 | Ht 68.0 in | Wt 201.8 lb

## 2020-01-16 DIAGNOSIS — Z8249 Family history of ischemic heart disease and other diseases of the circulatory system: Secondary | ICD-10-CM

## 2020-01-16 DIAGNOSIS — E785 Hyperlipidemia, unspecified: Secondary | ICD-10-CM | POA: Diagnosis not present

## 2020-01-16 DIAGNOSIS — I493 Ventricular premature depolarization: Secondary | ICD-10-CM | POA: Diagnosis not present

## 2020-01-16 DIAGNOSIS — E669 Obesity, unspecified: Secondary | ICD-10-CM | POA: Diagnosis not present

## 2020-01-16 NOTE — Progress Notes (Signed)
Primary Care Provider: Sharilyn Sites, MD Cardiologist: No primary care provider on file. Electrophysiologist: None  Clinic Note: Chief Complaint  Patient presents with  . Follow-up    Annual; discussed results coronary calcium score    HPI:    Charles [name] is a 69 y.o. male with a PMH notable for cardiac risk factors (obesity, hypertension, hyperlipidemia and significant family history) below who presents today for annual follow-up.  Family history of CAD: Father had MI in his 24s, brother had MI with multivessel disease having CABG at 44 and died at 72.  Notably had diabetes.  Charles [name] was last seen on January 14, 2019.  Doing well.  Enjoying retirement.  Not doing so well with weight loss however.  Not begun to go to the gym because of COVID-19 lockdown hurt him.  He gained 10 pounds.  Was getting outside doing exercise with the weather improving.  Also doing a treadmill and stationary bicycle.  Enjoys doing outside yard work, and odd jobs around [employer]..  Plan was to try to achieve 160 pounds.  Recent Hospitalizations: None  Reviewed  CV studies:    The following studies were reviewed today: (if available, images/films reviewed: From Epic Chart or Care Everywhere)  Coronary calcium score (02/22/2019: Score was 5.5.  Low risk.  Scattered aortic atherosclerosis.   Interval History:   Charles [name] returns today overall doing fairly well.  He not having any issues at all with chest pain or shortness of breath.  He tries to walk as often as he can and only really notes getting short of breath if he really walks quickly uphill or upstairs.  He has a "home gym "set up with a treadmill and weights and total gym -he tries exercise as often as he can, but does not really have a true exercise regimen program. He said a month or so ago he just felt weird and had a tight sensation in his chest that lasted about 2 to 3 hours and then felt funny for the whole day.  After  that things felt better and he has any issues since.  Actually the symptoms happened at rest and got better with walking. Otherwise doing very well with no major complaints.  CV Review of Symptoms (Summary) with the exception of the one episode noted a month ago:: no chest pain or dyspnea on exertion positive for - 1 episode of chest pain 1 month ago severity negative for - edema, irregular heartbeat, orthopnea, palpitations, paroxysmal nocturnal dyspnea, rapid heart rate or shortness of breath, syncope/near syncope or TIA/amaurosis fugax claudication  The patient does not have symptoms concerning for COVID-19 infection (fever, chills, cough, or new shortness of breath).   REVIEWED OF SYSTEMS   Review of Systems  Constitutional: Negative for malaise/fatigue and weight loss.  HENT: Negative for nosebleeds.   Respiratory: Negative for cough and shortness of breath.   Gastrointestinal: Negative for blood in stool and melena.  Genitourinary: Negative for hematuria.  Musculoskeletal: Positive for joint pain. Negative for falls.  Neurological: Negative for dizziness (Sometimes when he bends over) and headaches.  Psychiatric/Behavioral: Negative for memory loss. The patient is not nervous/anxious and does not have insomnia.    I have reviewed and (if needed) personally updated the patient's problem list, medications, allergies, past medical and surgical history, social and family history.   PAST MEDICAL HISTORY   Past Medical History:  Diagnosis Date  . Allergy   . Hyperlipidemia  well controlled  . PONV (postoperative nausea and vomiting)   . Sleep apnea    had surgery to correct - no cpap   . Sleep apnea, central 05/31/2009   NOCTURNAL POLYSOMNOGRAM -(DR Constance Holster, ENT) MILD SLEEP APNEA SYNDROME ,MILD HYPOVENTILATION ; ABN SLEEP ARCHITECTURE WITH POOR SLEEP EFFICIENCY AND REDUCED SLOW WAVE SLEEP RESULTS AN INCREASESD STAGE 2 SLEEP. ;; REC 2 L QHS O2 - No longer on CPAP - Had ENT surgery in  2008  . Umbilical hernia 6/0/1093   ASYMPTOMATIC WITH NO GI  SYMPTOMS  . Ventral hernia 11/30/2012   ASYMPTOMATIC    PAST SURGICAL HISTORY   Past Surgical History:  Procedure Laterality Date  . CARPAL TUNNEL RELEASE Bilateral   . COLONOSCOPY     x 2- normal   . DOPPLER ECHOCARDIOGRAPHY  09/08/2011   EF =>23% ,LV SYSTOLIC FUNCTION NORMAL   . FOOT SURGERY Right 1985  . LUMBAR LAMINECTOMY  1970  . NASAL SEPTUM SURGERY  1970  . SHOULDER SURGERY Bilateral   . sleep apnea surgery     removal of soft palate, septoplasty #2, tonsillectomy   . TONSILLECTOMY  10/11/2008   DR ROSEN-- with uvulopalatophrayngoplasty,revision nasal septolasty  . TREADMILL EXERCISE STRESS TEST  01/01/2012; 07/2013   a) 9/'13:NORMOTENSIVE REPONSE TO EXERCISE; GOOD EXERCISE TOLERANCE;NO ECG evidence of exercise-induced ischemia; rare PVCs;; b) 4/'15: Non-ischemic, EF 62%  . UMBILICAL HERNIA REPAIR      Immunization History  Administered Date(s) Administered  . Influenza-Unspecified 01/26/2013    MEDICATIONS/ALLERGIES   Current Meds  Medication Sig  . Ascorbic Acid (VITAMIN C PO) Take 1,000 mg by mouth.  Marland Kitchen aspirin EC 81 MG tablet Take 81 mg by mouth daily.  Marland Kitchen BIOTIN PO Take 1 tablet by mouth as needed.  . Calcium Carbonate-Vit D-Min (CALCIUM 1200 PO) Take by mouth.  . Cholecalciferol (VITAMIN D3) 400 UNITS CAPS Take by mouth.  . Coenzyme Q10 (CO Q 10 PO) Take 30 mg by mouth daily.  . Cyanocobalamin (VITAMIN B 12 PO) Take 1,000 mg by mouth daily.  . fenofibrate 160 MG tablet Take 160 mg by mouth daily.  . folic acid (FOLVITE) 1 MG tablet Take 1 mg by mouth daily.  . Glucosamine HCl (GLUCOSAMINE PO) Take by mouth.  Marland Kitchen ibuprofen (ADVIL,MOTRIN) 200 MG tablet Take 200 mg by mouth every 6 (six) hours as needed.  . Magnesium 200 MG TABS Take by mouth.  . Melatonin 5 MG TABS Take 5 mg by mouth daily.  . meloxicam (MOBIC) 15 MG tablet Take 15 mg by mouth as needed.   . Multiple Vitamin (MULTIVITAMIN) tablet Take 1  tablet by mouth daily.  . Omega-3 Fatty Acids (FISH OIL PO) Take by mouth. Take 2 caps in morning and 1 caps in evening  . Psyllium (KONSYL DAILY FIBER PO) Take by mouth 2 (two) times daily after a meal.  . sildenafil (REVATIO) 20 MG tablet Take 20 mg by mouth as needed.  . simvastatin (ZOCOR) 20 MG tablet Take 20 mg by mouth daily.  . tamsulosin (FLOMAX) 0.4 MG CAPS capsule Take 0.4 mg by mouth daily after supper.  . TURMERIC PO Take 1 tablet by mouth.  . Zinc 50 MG TABS Take by mouth daily.    Allergies  Allergen Reactions  . Penicillins Rash    "many, many, many years ago"    SOCIAL HISTORY/FAMILY HISTORY   Reviewed in Epic:  Pertinent findings: Home gym with TM, light weights & a "total gym" machine.  Tries  to exercise as often as he can, at least 2 to 3 days a week.  OBJCTIVE -PE, EKG, labs   Wt Readings from Last 3 Encounters:  01/16/20 201 lb 12.8 oz (91.5 kg)  01/14/19 198 lb (89.8 kg)  06/02/18 198 lb (89.8 kg)    Physical Exam: BP 128/70   Pulse 71   Ht 5\' 8"  (1.727 m)   Wt 201 lb 12.8 oz (91.5 kg)   SpO2 97%   BMI 30.68 kg/m  Physical Exam Vitals reviewed.  Constitutional:      General: He is not in acute distress.    Appearance: Normal appearance. He is obese.     Comments: Well-groomed.  Healthy-appearing  HENT:     Head: Normocephalic.  Neck:     Vascular: No carotid bruit, hepatojugular reflux or JVD.  Cardiovascular:     Rate and Rhythm: Normal rate and regular rhythm.  No extrasystoles are present.    Chest Wall: PMI is not displaced.     Pulses: Normal pulses and intact distal pulses.     Heart sounds: Normal heart sounds. No murmur heard.  No friction rub. No gallop.   Pulmonary:     Effort: Pulmonary effort is normal. No respiratory distress.     Breath sounds: Normal breath sounds.  Musculoskeletal:        General: No swelling. Normal range of motion.     Cervical back: Normal range of motion.  Neurological:     General: No focal  deficit present.     Mental Status: He is alert and oriented to person, place, and time.  Psychiatric:        Mood and Affect: Mood normal.        Behavior: Behavior normal.        Thought Content: Thought content normal.        Judgment: Judgment normal.     Adult ECG Report  Rate: 71 ;  Rhythm: normal sinus rhythm and left atrial abnormality; normal axis, intervals and durations.  Narrative Interpretation: Borderline, mostly normal EKG  Recent Labs:  Due 01/24/2020 Lab Results  Component Value Date   CHOL 113 (L) 08/14/2015   HDL 41 08/14/2015   LDLCALC 48 08/14/2015   TRIG 122 08/14/2015   CHOLHDL 2.8 08/14/2015   Lab Results  Component Value Date   CREATININE 1.15 08/14/2015   BUN 25 08/14/2015   NA 142 08/14/2015   K 5.0 08/14/2015   CL 105 08/14/2015   CO2 25 08/14/2015   Lab Results  Component Value Date   TSH 0.526 11/30/2012    ASSESSMENT/PLAN    Problem List Items Addressed This Visit    Frequent PVCs (Chronic)    Asymptomatic.  I did not hear ectopy on exam today.  Not seen on EKG.  Currently not on a beta-blocker or anything for blood pressure.  Waiting to have hypertension, would consider long-acting beta-blocker such as Toprol or nadolol.  Otherwise would not treat as he is asymptomatic.      Relevant Orders   EKG 12-Lead (Completed)   Dyslipidemia, goal LDL below 100 - on statin & fibrate (Chronic)    He is due for labs be checked in December.-Well-controlled as of last year.  He is on co-Q10, fenofibrate and moderate dose simvastatin.  Tolerating relatively well.      Obesity (BMI 30.0-34.9) (Chronic)    He has done very well with weight loss, but has plateaued now.  He was hoping to lose  back the weight and was not able to do so.  We talked about adjusting diet and increasing exercise.      Family history of cardiovascular disease - Primary (Chronic)    Family history of CAD, but heart Score is very low, low risk scattered aortic atherosclerosis.   He is not having any active angina.  At present, I would simply continue to monitor blood pressure and treat lipids with a low-dose statin.  Target LDL should be less than 100.          COVID-19 Education: The signs and symptoms of COVID-19 were discussed with the patient and how to seek care for testing (follow up with PCP or arrange E-visit).   The importance of social distancing and COVID-19 vaccination was discussed today. 2 min not interested in discussing The patient is practicing social distancing & Masking.   I spent a total of 50minutes with the patient spent in direct patient consultation.  Additional time spent with chart review  / charting (studies, outside notes, etc): 6 Total Time: 24 min   Current medicines are reviewed at length with the patient today.  (+/- concerns) none  Notice: This dictation was prepared with Dragon dictation along with smaller phrase technology. Any transcriptional errors that result from this process are unintentional and may not be corrected upon review.  Patient Instructions / Medication Changes & Studies & Tests Ordered   Patient Instructions  Medication Instructions:  No changes *If you need a refill on your cardiac medications before your next appointment, please call your pharmacy*   Lab Work: Not needed  If you have labs (blood work) drawn today and your tests are completely normal, you will receive your results only by: Marland Kitchen MyChart Message (if you have MyChart) OR . A paper copy in the mail If you have any lab test that is abnormal or we need to change your treatment, we will call you to review the results.   Testing/Procedures: Not needed   Follow-Up: At [facility], you and your health needs are our priority.  As part of our continuing mission to provide you with exceptional heart care, we have created designated Provider Care Teams.  These Care Teams include your primary Cardiologist (physician) and Advanced Practice  Providers (APPs -  Physician Assistants and Nurse Practitioners) who all work together to provide you with the care you need, when you need it.  We recommend signing up for the patient portal called "MyChart".  Sign up information is provided on this After Visit Summary.  MyChart is used to connect with patients for Virtual Visits (Telemedicine).  Patients are able to view lab/test results, encounter notes, upcoming appointments, etc.  Non-urgent messages can be sent to your provider as well.   To learn more about what you can do with MyChart, go to [email or web address].    Your next appointment:   12 month(s)  The format for your next appointment:   In Person  Provider:   Glenetta Hew, MD   Other Instructions n/a    Studies Ordered:   Orders Placed This Encounter  Procedures  . EKG 12-Lead     Glenetta Hew, M.D., M.S. Interventional Cardiologist   Pager # [phone] Phone # [phone] [address] [place], [place] [address]   Thank you for choosing [facility] at [facility]!!

## 2020-01-16 NOTE — Patient Instructions (Signed)
Medication Instructions:  No changes *If you need a refill on your cardiac medications before your next appointment, please call your pharmacy*   Lab Work: Not needed  If you have labs (blood work) drawn today and your tests are completely normal, you will receive your results only by: Marland Kitchen MyChart Message (if you have MyChart) OR . A paper copy in the mail If you have any lab test that is abnormal or we need to change your treatment, we will call you to review the results.   Testing/Procedures: Not needed   Follow-Up: At [facility], you and your health needs are our priority.  As part of our continuing mission to provide you with exceptional heart care, we have created designated Provider Care Teams.  These Care Teams include your primary Cardiologist (physician) and Advanced Practice Providers (APPs -  Physician Assistants and Nurse Practitioners) who all work together to provide you with the care you need, when you need it.  We recommend signing up for the patient portal called "MyChart".  Sign up information is provided on this After Visit Summary.  MyChart is used to connect with patients for Virtual Visits (Telemedicine).  Patients are able to view lab/test results, encounter notes, upcoming appointments, etc.  Non-urgent messages can be sent to your provider as well.   To learn more about what you can do with MyChart, go to [email or web address].    Your next appointment:   12 month(s)  The format for your next appointment:   In Person  Provider:   Glenetta Hew, MD   Other Instructions n/a

## 2020-01-20 ENCOUNTER — Encounter: Payer: Self-pay | Admitting: Cardiology

## 2020-01-20 NOTE — Assessment & Plan Note (Signed)
He is due for labs be checked in December.-Well-controlled as of last year.  He is on co-Q10, fenofibrate and moderate dose simvastatin.  Tolerating relatively well.

## 2020-01-20 NOTE — Assessment & Plan Note (Signed)
He has done very well with weight loss, but has plateaued now.  He was hoping to lose back the weight and was not able to do so.  We talked about adjusting diet and increasing exercise.

## 2020-01-20 NOTE — Assessment & Plan Note (Signed)
Family history of CAD, but heart Score is very low, low risk scattered aortic atherosclerosis.  He is not having any active angina.  At present, I would simply continue to monitor blood pressure and treat lipids with a low-dose statin.  Target LDL should be less than 100.

## 2020-01-20 NOTE — Assessment & Plan Note (Signed)
Asymptomatic.  I did not hear ectopy on exam today.  Not seen on EKG.  Currently not on a beta-blocker or anything for blood pressure.  Waiting to have hypertension, would consider long-acting beta-blocker such as Toprol or nadolol.  Otherwise would not treat as he is asymptomatic.

## 2020-01-24 DIAGNOSIS — Z6831 Body mass index (BMI) 31.0-31.9, adult: Secondary | ICD-10-CM | POA: Diagnosis not present

## 2020-01-24 DIAGNOSIS — G4730 Sleep apnea, unspecified: Secondary | ICD-10-CM | POA: Diagnosis not present

## 2020-01-24 DIAGNOSIS — Z125 Encounter for screening for malignant neoplasm of prostate: Secondary | ICD-10-CM | POA: Diagnosis not present

## 2020-01-24 DIAGNOSIS — Z23 Encounter for immunization: Secondary | ICD-10-CM | POA: Diagnosis not present

## 2020-01-24 DIAGNOSIS — E7849 Other hyperlipidemia: Secondary | ICD-10-CM | POA: Diagnosis not present

## 2020-01-24 DIAGNOSIS — Z1389 Encounter for screening for other disorder: Secondary | ICD-10-CM | POA: Diagnosis not present

## 2020-01-24 DIAGNOSIS — M5030 Other cervical disc degeneration, unspecified cervical region: Secondary | ICD-10-CM | POA: Diagnosis not present

## 2020-01-24 DIAGNOSIS — Z1331 Encounter for screening for depression: Secondary | ICD-10-CM | POA: Diagnosis not present

## 2020-01-24 DIAGNOSIS — Z0001 Encounter for general adult medical examination with abnormal findings: Secondary | ICD-10-CM | POA: Diagnosis not present

## 2020-01-24 DIAGNOSIS — E782 Mixed hyperlipidemia: Secondary | ICD-10-CM | POA: Diagnosis not present

## 2020-01-26 DIAGNOSIS — M5030 Other cervical disc degeneration, unspecified cervical region: Secondary | ICD-10-CM | POA: Diagnosis not present

## 2020-01-26 DIAGNOSIS — E7849 Other hyperlipidemia: Secondary | ICD-10-CM | POA: Diagnosis not present

## 2020-03-21 DIAGNOSIS — H52229 Regular astigmatism, unspecified eye: Secondary | ICD-10-CM | POA: Diagnosis not present

## 2020-03-21 DIAGNOSIS — Z135 Encounter for screening for eye and ear disorders: Secondary | ICD-10-CM | POA: Diagnosis not present

## 2020-03-21 DIAGNOSIS — Z0100 Encounter for examination of eyes and vision without abnormal findings: Secondary | ICD-10-CM | POA: Diagnosis not present

## 2020-03-27 DIAGNOSIS — M5030 Other cervical disc degeneration, unspecified cervical region: Secondary | ICD-10-CM | POA: Diagnosis not present

## 2020-03-27 DIAGNOSIS — E7849 Other hyperlipidemia: Secondary | ICD-10-CM | POA: Diagnosis not present

## 2020-07-25 DIAGNOSIS — E6609 Other obesity due to excess calories: Secondary | ICD-10-CM | POA: Diagnosis not present

## 2020-07-25 DIAGNOSIS — K219 Gastro-esophageal reflux disease without esophagitis: Secondary | ICD-10-CM | POA: Diagnosis not present

## 2020-07-25 DIAGNOSIS — E7849 Other hyperlipidemia: Secondary | ICD-10-CM | POA: Diagnosis not present

## 2020-09-25 DIAGNOSIS — E6609 Other obesity due to excess calories: Secondary | ICD-10-CM | POA: Diagnosis not present

## 2020-09-25 DIAGNOSIS — M5030 Other cervical disc degeneration, unspecified cervical region: Secondary | ICD-10-CM | POA: Diagnosis not present

## 2020-09-25 DIAGNOSIS — E782 Mixed hyperlipidemia: Secondary | ICD-10-CM | POA: Diagnosis not present

## 2020-11-25 DIAGNOSIS — M5030 Other cervical disc degeneration, unspecified cervical region: Secondary | ICD-10-CM | POA: Diagnosis not present

## 2020-11-25 DIAGNOSIS — E782 Mixed hyperlipidemia: Secondary | ICD-10-CM | POA: Diagnosis not present

## 2020-12-11 DIAGNOSIS — Z6828 Body mass index (BMI) 28.0-28.9, adult: Secondary | ICD-10-CM | POA: Diagnosis not present

## 2020-12-11 DIAGNOSIS — E782 Mixed hyperlipidemia: Secondary | ICD-10-CM | POA: Diagnosis not present

## 2020-12-11 DIAGNOSIS — Z0000 Encounter for general adult medical examination without abnormal findings: Secondary | ICD-10-CM | POA: Diagnosis not present

## 2020-12-11 DIAGNOSIS — G4730 Sleep apnea, unspecified: Secondary | ICD-10-CM | POA: Diagnosis not present

## 2020-12-11 DIAGNOSIS — Z1389 Encounter for screening for other disorder: Secondary | ICD-10-CM | POA: Diagnosis not present

## 2020-12-11 DIAGNOSIS — E663 Overweight: Secondary | ICD-10-CM | POA: Diagnosis not present

## 2020-12-11 DIAGNOSIS — R001 Bradycardia, unspecified: Secondary | ICD-10-CM | POA: Diagnosis not present

## 2020-12-11 DIAGNOSIS — M5030 Other cervical disc degeneration, unspecified cervical region: Secondary | ICD-10-CM | POA: Diagnosis not present

## 2020-12-11 DIAGNOSIS — N529 Male erectile dysfunction, unspecified: Secondary | ICD-10-CM | POA: Diagnosis not present

## 2020-12-11 DIAGNOSIS — Z1331 Encounter for screening for depression: Secondary | ICD-10-CM | POA: Diagnosis not present

## 2021-01-21 NOTE — Progress Notes (Signed)
Primary Care Provider: Sharilyn Sites, MD Cardiologist: None Electrophysiologist: None  Clinic Note: Chief Complaint  Patient presents with   Follow-up     annual.  Doing very well.  Feels as well as he had in 3 years.  Continues to lose weight.   ===================================  ASSESSMENT/PLAN   Problem List Items Addressed This Visit       Cardiology Problems   Frequent PVCs (Chronic)    These seem to be well controlled.  No further complaints. None noted on exam or EKG.  In the absence of hypertension and ongoing symptoms, will avoid therapy.      Relevant Orders   EKG 12-Lead   Dyslipidemia, goal LDL below 100 - on statin & fibrate (Chronic)    Most recent labs look good.  Well within goal on modest dose of simvastatin along with fenofibrate.  No notable myalgias.  Occasional cramping treated with mustard.  Does not sound like statin myopathy.  Combination of medications and ongoing weight loss with diet and exercise changes seems to have made dramatic effect. I congratulated him on his efforts.        Other   Obesity (BMI 30.0-34.9) (Chronic)    No longer obese.  BMI down to 27 as he is down to 177 here but at home 172.  This is almost 40 pounds down in the past 3 years.  Is hoping to continue to gradually trim off a little bit more weight.  Hoping to get into the mid 160s.  Has made significant adjustments to his diet and is staying active with exercises and walking.      Family history of cardiovascular disease - Primary (Chronic)    He himself is doing very well.  Low risk cardiac calcium score.  Well-controlled lipids and blood pressure.  Staying active with no symptoms. He would like to continue following up every year or so       ===================================  HPI:    Charles [name] is an obese 70 y.o. male with CRFs of HTN, HLD And Significant Family History of CAD (father with MI in his 86s, but brother with MI and MV CAD-CABG at  32-died at 82)) and Coronary Calcium Score of 5.5 with Scattered Aortic Atherosclerosis who presents today for annual follow-up.  Charles [name] was last seen on 01/16/2020.  He was doing fairly well.  Enjoying being 1 year out from retirement, just a little bored.  Had not really gotten into doing the gym that he plan to do because of COVID-19 lockdown.  Was trying to walk as often as he could, only noted exertional dyspnea going uphill or up steps.  He actually set up a home gym with a treadmill and weights as well as [employer] and exercises often as he can.  He noted 1 strange episode of chest discomfort lasted a few hours a week or 2 prior to the clinic visit.  Otherwise doing well.  Symptoms happen at rest and got better with exertion.  Recent Hospitalizations: None  Reviewed  CV studies:    The following studies were reviewed today: (if available, images/films reviewed: From Epic Chart or Care Everywhere) None:   Interval History:   Charles [name] returns here today for annual follow-up doing very well.  He is very active working out most days.  He also walks quite a bit.  He enjoys doing lots of different projects and chores around the house.  He states that he is feeling  better than he did last year. -Being very cognizant about the eating their diet.   Working on physical & personal health. Keeping himseldf active with projects helps from getting depressed & sedentary.  Only gets SOB if overdoes it -- may need to stop to relax legs more than DOE.  -- Not limiting.  He used to get more frequent cramps, but has been doing really well with only one cramp last week the first in the year.  He takes a daily spoonful of mustard along with a vitamin supplement that has magnesium and potassium.  Essentially asymptomatic from a cardiac standpoint-no complaints.  Feeling well.  CV Review of Symptoms (Summary) Cardiovascular ROS: no chest pain or dyspnea on exertion negative for - edema,  irregular heartbeat, orthopnea, palpitations, paroxysmal nocturnal dyspnea, rapid heart rate, shortness of breath, or lightheaded, dizzy, syncope/near sncope; TIA/amaurosis fugax ; claudication   REVIEWED OF SYSTEMS   Review of Systems  Constitutional:  Negative for malaise/fatigue and weight loss.  Respiratory:  Negative for shortness of breath.   Cardiovascular:  Negative for leg swelling.  Gastrointestinal:  Negative for blood in stool and melena.  Genitourinary:  Negative for hematuria.       ~2-3 x / night nocturia  Musculoskeletal:  Positive for joint pain (kneess & back may ache if overdoes it.) and myalgias (very rare cramps). Negative for falls.  Neurological:  Negative for dizziness, focal weakness and headaches.  Psychiatric/Behavioral:  Negative for depression and memory loss. The patient is not nervous/anxious and does not have insomnia.        Doing well.    I have reviewed and (if needed) personally updated the patient's problem list, medications, allergies, past medical and surgical history, social and family history.   PAST MEDICAL HISTORY   Past Medical History:  Diagnosis Date   Allergy    Hyperlipidemia    well controlled   PONV (postoperative nausea and vomiting)    Sleep apnea    had surgery to correct - no cpap    Sleep apnea, central 05/31/2009   NOCTURNAL POLYSOMNOGRAM -(DR Constance Holster, ENT) MILD SLEEP APNEA SYNDROME ,MILD HYPOVENTILATION ; ABN SLEEP ARCHITECTURE WITH POOR SLEEP EFFICIENCY AND REDUCED SLOW WAVE SLEEP RESULTS AN INCREASESD STAGE 2 SLEEP. ;; REC 2 L QHS O2 - No longer on CPAP - Had ENT surgery in 8527   Umbilical hernia 11/02/2421   ASYMPTOMATIC WITH NO GI  SYMPTOMS   Ventral hernia 11/30/2012   ASYMPTOMATIC    PAST SURGICAL HISTORY   Past Surgical History:  Procedure Laterality Date   CARPAL TUNNEL RELEASE Bilateral    COLONOSCOPY     x 2- normal    DOPPLER ECHOCARDIOGRAPHY  09/08/2011   EF =>53% ,LV SYSTOLIC FUNCTION NORMAL    FOOT SURGERY  Right 1985   LUMBAR LAMINECTOMY  1970   NASAL SEPTUM SURGERY  1970   SHOULDER SURGERY Bilateral    sleep apnea surgery     removal of soft palate, septoplasty #2, tonsillectomy    TONSILLECTOMY  10/11/2008   DR ROSEN-- with uvulopalatophrayngoplasty,revision nasal septolasty   TREADMILL EXERCISE STRESS TEST  01/01/2012; 07/2013   a) 9/'13:NORMOTENSIVE REPONSE TO EXERCISE; GOOD EXERCISE TOLERANCE;NO ECG evidence of exercise-induced ischemia; rare PVCs;; b) 4/'15: Non-ischemic, EF 61%   UMBILICAL HERNIA REPAIR      Immunization History  Administered Date(s) Administered   Influenza-Unspecified 01/26/2013    MEDICATIONS/ALLERGIES   Current Meds  Medication Sig   Ascorbic Acid (VITAMIN C PO) Take 1,000 mg  by mouth.   aspirin EC 81 MG tablet Take 81 mg by mouth daily.   BIOTIN PO Take 1 tablet by mouth as needed.   Calcium Carbonate-Vit D-Min (CALCIUM 1200 PO) Take by mouth.   Cholecalciferol (VITAMIN D3) 400 UNITS CAPS Take by mouth.   Coenzyme Q10 (CO Q 10 PO) Take 30 mg by mouth daily.   Cyanocobalamin (VITAMIN B 12 PO) Take 1,000 mg by mouth daily.   fenofibrate 160 MG tablet Take 160 mg by mouth daily.   folic acid (FOLVITE) 1 MG tablet Take 1 mg by mouth daily.   Glucosamine HCl (GLUCOSAMINE PO) Take by mouth.   ibuprofen (ADVIL,MOTRIN) 200 MG tablet Take 200 mg by mouth every 6 (six) hours as needed.   Magnesium 200 MG TABS Take by mouth.   Melatonin 5 MG TABS Take 5 mg by mouth daily.   meloxicam (MOBIC) 15 MG tablet Take 15 mg by mouth as needed.    Multiple Vitamin (MULTIVITAMIN) tablet Take 1 tablet by mouth daily.   Omega-3 Fatty Acids (FISH OIL PO) Take by mouth. Take 2 caps in morning and 1 caps in evening   Psyllium (KONSYL DAILY FIBER PO) Take by mouth 2 (two) times daily after a meal.   sildenafil (REVATIO) 20 MG tablet Take 20 mg by mouth as needed.   simvastatin (ZOCOR) 20 MG tablet Take 20 mg by mouth daily.   tamsulosin (FLOMAX) 0.4 MG CAPS capsule Take 0.4 mg by  mouth daily after supper.   TURMERIC PO Take 1 tablet by mouth.   Zinc 50 MG TABS Take by mouth daily.    Allergies  Allergen Reactions   Penicillins Rash    "many, many, many years ago"    SOCIAL HISTORY/FAMILY HISTORY   Reviewed in Epic:  Pertinent findings:  Social History   Tobacco Use   Smoking status: Never   Smokeless tobacco: Never  Substance Use Topics   Alcohol use: Yes    Comment: occasionally    Drug use: No   Social History   Social History Narrative   Married father of 2 with 1 grandchild. 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   He does not do any regular walking.  He is to use his home workout machine, but now is noted that he really hasn't had the same amount of time at the end of the day the use to. Using working from 6 AM to 7 PM.    OBJCTIVE -PE, EKG, labs   Wt Readings from Last 3 Encounters:  01/22/21 177 lb 12.8 oz (80.6 kg)  01/16/20 201 lb 12.8 oz (91.5 kg)  01/14/19 198 lb (89.8 kg)    Physical Exam: BP 120/62 (BP Location: Right Arm)   Pulse 65   Ht 5\' 8"  (1.727 m)   Wt 177 lb 12.8 oz (80.6 kg)   SpO2 98%   BMI 27.03 kg/m  Physical Exam Vitals reviewed.  Constitutional:      General: He is not in acute distress.    Appearance: Normal appearance. He is normal weight. He is not ill-appearing or toxic-appearing.     Comments: Well-nourished, well-groomed.  Healthy-appearing. - notable wgt loss over past few years  HENT:     Head: Normocephalic and  atraumatic.  Neck:     Vascular: No carotid bruit.  Cardiovascular:     Rate and Rhythm: Normal rate and regular rhythm. No extrasystoles are present.    Chest Wall: PMI is not displaced.     Pulses: Normal pulses.     Heart sounds: S1 normal and S2 normal. No murmur heard.   No friction rub. No gallop.   Pulmonary:     Effort: Pulmonary effort is normal. No respiratory distress.     Breath sounds: Normal breath sounds. No wheezing, rhonchi or rales.  Chest:     Chest wall: No tenderness.  Musculoskeletal:        General: No swelling. Normal range of motion.     Cervical back: Normal range of motion and neck supple.  Skin:    General: Skin is warm and dry.  Neurological:     General: No focal deficit present.     Mental Status: He is alert and oriented to person, place, and time.  Psychiatric:        Mood and Affect: Mood normal.        Behavior: Behavior normal.        Thought Content: Thought content normal.        Judgment: Judgment normal.     Adult ECG Report  Rate: 65;  Rhythm: normal sinus rhythm; normal axis, intervals and durations.  Narrative Interpretation: Normal  Recent Labs:    12/11/2020: TC 118, TG 85, HDL 49, LDL 65; Hgb 14.4. ->  Per report, labs otherwise normal.   No results found for: HGBA1C   ==================================================  COVID-19 Education: The signs and symptoms of COVID-19 were discussed with the patient and how to seek care for testing (follow up with PCP or arrange E-visit).    I spent a total of 40minutes with the patient spent in direct patient consultation.  Additional time spent with chart review  / charting (studies, outside notes, etc): 13 min Total Time: 31 min  Current medicines are reviewed at length with the patient today.  (+/- concerns) n/a  This visit occurred during the SARS-CoV-2 public health emergency.  Safety protocols were in place, including screening questions prior to the visit, additional usage of staff PPE, and extensive cleaning of exam room while observing appropriate contact time as indicated for disinfecting solutions.  Notice: This dictation was prepared with Dragon dictation along with smart phrase technology. Any transcriptional errors that result from this process are unintentional and may not  be corrected upon review.  Patient Instructions / Medication Changes & Studies & Tests Ordered   Patient Instructions  Medication Instructions:  No changes  *If you need a refill on your cardiac medications before your next appointment, please call your pharmacy*   Lab Work: Not needed    Testing/Procedures: Not needed   Follow-Up: At [facility], you and your health needs are our priority.  As part of our continuing mission to provide you with exceptional heart care, we have created designated Provider Care Teams.  These Care Teams include your primary Cardiologist (physician) and Advanced Practice Providers (APPs -  Physician Assistants and Nurse Practitioners) who all work together to provide you with the care you need, when you need it.  We recommend signing up for the patient portal called "MyChart".  Sign up information is provided on this After Visit Summary.  MyChart is used to connect with patients for Virtual Visits (Telemedicine).  Patients are able to view lab/test results, encounter notes, upcoming  appointments, etc.  Non-urgent messages can be sent to your provider as well.   To learn more about what you can do with MyChart, go to [email or web address].    Your next appointment:   12 month(s)  The format for your next appointment:   In Person  Provider:   Glenetta Hew, MD    Studies Ordered:   Orders Placed This Encounter  Procedures   EKG 12-Lead      Glenetta Hew, M.D., M.S. Interventional Cardiologist   Pager # [phone] Phone # [phone] [address] [place], [place] [address]   Thank you for choosing Heartcare at [facility]!!

## 2021-01-22 ENCOUNTER — Ambulatory Visit: Payer: Medicare HMO | Admitting: Cardiology

## 2021-01-22 ENCOUNTER — Other Ambulatory Visit: Payer: Self-pay

## 2021-01-22 ENCOUNTER — Encounter: Payer: Self-pay | Admitting: Cardiology

## 2021-01-22 VITALS — BP 120/62 | HR 65 | Ht 68.0 in | Wt 177.8 lb

## 2021-01-22 DIAGNOSIS — I493 Ventricular premature depolarization: Secondary | ICD-10-CM | POA: Diagnosis not present

## 2021-01-22 DIAGNOSIS — E669 Obesity, unspecified: Secondary | ICD-10-CM | POA: Diagnosis not present

## 2021-01-22 DIAGNOSIS — Z8249 Family history of ischemic heart disease and other diseases of the circulatory system: Secondary | ICD-10-CM

## 2021-01-22 DIAGNOSIS — Z6830 Body mass index (BMI) 30.0-30.9, adult: Secondary | ICD-10-CM | POA: Diagnosis not present

## 2021-01-22 DIAGNOSIS — E785 Hyperlipidemia, unspecified: Secondary | ICD-10-CM | POA: Diagnosis not present

## 2021-01-22 NOTE — Assessment & Plan Note (Signed)
No longer obese.  BMI down to 27 as he is down to 177 here but at home 172.  This is almost 40 pounds down in the past 3 years.  Is hoping to continue to gradually trim off a little bit more weight.  Hoping to get into the mid 160s.  Has made significant adjustments to his diet and is staying active with exercises and walking.

## 2021-01-22 NOTE — Assessment & Plan Note (Signed)
Most recent labs look good.  Well within goal on modest dose of simvastatin along with fenofibrate.  No notable myalgias.  Occasional cramping treated with mustard.  Does not sound like statin myopathy.  Combination of medications and ongoing weight loss with diet and exercise changes seems to have made dramatic effect. I congratulated him on his efforts.

## 2021-01-22 NOTE — Assessment & Plan Note (Signed)
These seem to be well controlled.  No further complaints. None noted on exam or EKG.  In the absence of hypertension and ongoing symptoms, will avoid therapy.

## 2021-01-22 NOTE — Patient Instructions (Signed)

## 2021-01-22 NOTE — Assessment & Plan Note (Addendum)
He himself is doing very well.  Low risk cardiac calcium score.  Well-controlled lipids and blood pressure.  Staying active with no symptoms. He would like to continue following up every year or so

## 2021-01-25 DIAGNOSIS — E782 Mixed hyperlipidemia: Secondary | ICD-10-CM | POA: Diagnosis not present

## 2021-01-25 DIAGNOSIS — M5030 Other cervical disc degeneration, unspecified cervical region: Secondary | ICD-10-CM | POA: Diagnosis not present

## 2021-02-26 DIAGNOSIS — Z23 Encounter for immunization: Secondary | ICD-10-CM | POA: Diagnosis not present

## 2021-03-27 DIAGNOSIS — M5030 Other cervical disc degeneration, unspecified cervical region: Secondary | ICD-10-CM | POA: Diagnosis not present

## 2021-03-27 DIAGNOSIS — E782 Mixed hyperlipidemia: Secondary | ICD-10-CM | POA: Diagnosis not present

## 2021-04-14 ENCOUNTER — Ambulatory Visit
Admission: RE | Admit: 2021-04-14 | Discharge: 2021-04-14 | Disposition: A | Discharge status: Home / Self Care | Payer: Medicare HMO | Source: Ambulatory Visit | Attending: Family Medicine | Admitting: Family Medicine

## 2021-04-14 ENCOUNTER — Other Ambulatory Visit: Payer: Self-pay

## 2021-04-14 VITALS — BP 115/69 | HR 76 | Temp 97.3°F | Resp 14

## 2021-04-14 DIAGNOSIS — R109 Unspecified abdominal pain: Secondary | ICD-10-CM | POA: Diagnosis not present

## 2021-04-14 DIAGNOSIS — R197 Diarrhea, unspecified: Secondary | ICD-10-CM | POA: Diagnosis not present

## 2021-04-14 NOTE — ED Triage Notes (Signed)
C/O starting with some abd pains w/ nausea onset 1 wk ago; 3 days ago intermittent abd pains became severe.  Denies any n/v.Today states he has not had any abd pain and had one "more solid" BM this AM - took first dose Imodium last night.

## 2021-04-14 NOTE — ED Provider Notes (Signed)
[facility]    CSN: [id number] Arrival date & time: 04/14/21  1751      History   Chief Complaint Chief Complaint  Patient presents with   Abdominal Pain   Diarrhea   Appointment    0900    HPI Charles [name] is a 70 y.o. male.   Patient presenting today with 5-day history of abdominal cramping that started in upper abdomen and moved down to bilateral lower abdomen, diarrhea, fatigue.  Denies fever, chills, sweats, nausea, vomiting, upper respiratory symptoms, urinary symptoms.  States he is feeling much better today and having no abdominal pain, had a firm bowel movement this morning.  Did take some Imodium last night for the first time and has been eating bland foods, staying well-hydrated with electrolyte drinks.  No known sick contacts, no new foods or medications.  No chronic GI issues.   Past Medical History:  Diagnosis Date   Allergy    Hyperlipidemia    well controlled   PONV (postoperative nausea and vomiting)    Sleep apnea    had surgery to correct - no cpap    Sleep apnea, central 05/31/2009   NOCTURNAL POLYSOMNOGRAM -(DR Constance Holster, ENT) MILD SLEEP APNEA SYNDROME ,MILD HYPOVENTILATION ; ABN SLEEP ARCHITECTURE WITH POOR SLEEP EFFICIENCY AND REDUCED SLOW WAVE SLEEP RESULTS AN INCREASESD STAGE 2 SLEEP. ;; REC 2 L QHS O2 - No longer on CPAP - Had ENT surgery in 0258   Umbilical hernia 08/27/7780   ASYMPTOMATIC WITH NO GI  SYMPTOMS   Ventral hernia 11/30/2012   ASYMPTOMATIC    Patient Active Problem List   Diagnosis Date Noted   Family history of cardiovascular disease 08/13/2016   Obesity (BMI 30.0-34.9) 08/14/2014   Frequent PVCs 07/21/2013   Dyslipidemia, goal LDL below 100 - on statin & fibrate 07/21/2013    Past Surgical History:  Procedure Laterality Date   BACK SURGERY     CARPAL TUNNEL RELEASE Bilateral    COLONOSCOPY     x 2- normal    DOPPLER ECHOCARDIOGRAPHY  09/08/2011   EF =>42% ,LV SYSTOLIC FUNCTION NORMAL    FOOT SURGERY Right 1985    HERNIA REPAIR     LUMBAR LAMINECTOMY  1970   NASAL SEPTUM SURGERY  1970   SHOULDER SURGERY Bilateral    sleep apnea surgery     removal of soft palate, septoplasty #2, tonsillectomy    TONSILLECTOMY  10/11/2008   DR Constance Holster-- with uvulopalatophrayngoplasty,revision nasal septolasty   TONSILLECTOMY     TREADMILL EXERCISE STRESS TEST  01/01/2012; 07/2013   a) 9/'13:NORMOTENSIVE REPONSE TO EXERCISE; GOOD EXERCISE TOLERANCE;NO ECG evidence of exercise-induced ischemia; rare PVCs;; b) 4/'15: Non-ischemic, EF 35%   UMBILICAL HERNIA REPAIR         Home Medications    Prior to Admission medications   Medication Sig Start Date End Date Taking? Authorizing Provider  Ascorbic Acid (VITAMIN C PO) Take 1,000 mg by mouth.    [provider]  aspirin EC 81 MG tablet Take 81 mg by mouth daily.    [provider]  BIOTIN PO Take 1 tablet by mouth as needed.    [provider]  Calcium Carbonate-Vit D-Min (CALCIUM 1200 PO) Take by mouth.    [provider]  Cholecalciferol (VITAMIN D3) 400 UNITS CAPS Take by mouth.    [provider]  Coenzyme Q10 (CO Q 10 PO) Take 30 mg by mouth daily.    [provider]  Cyanocobalamin (VITAMIN B  12 PO) Take 1,000 mg by mouth daily.    [provider]  fenofibrate 160 MG tablet Take 160 mg by mouth daily.    [provider]  folic acid (FOLVITE) 1 MG tablet Take 1 mg by mouth daily.    [provider]  Glucosamine HCl (GLUCOSAMINE PO) Take by mouth.    [provider]  ibuprofen (ADVIL,MOTRIN) 200 MG tablet Take 200 mg by mouth every 6 (six) hours as needed.    [provider]  Magnesium 200 MG TABS Take by mouth.    [provider]  Melatonin 5 MG TABS Take 5 mg by mouth daily.    [provider]  meloxicam (MOBIC) 15 MG tablet Take 15 mg by mouth as needed.  07/16/15   [provider]  Multiple Vitamin (MULTIVITAMIN) tablet Take 1 tablet by  mouth daily.    [provider]  Omega-3 Fatty Acids (FISH OIL PO) Take by mouth. Take 2 caps in morning and 1 caps in evening    [provider]  Psyllium (KONSYL DAILY FIBER PO) Take by mouth 2 (two) times daily after a meal.    [provider]  sildenafil (REVATIO) 20 MG tablet Take 20 mg by mouth as needed.    [provider]  simvastatin (ZOCOR) 20 MG tablet Take 20 mg by mouth daily.    [provider]  tamsulosin (FLOMAX) 0.4 MG CAPS capsule Take 0.4 mg by mouth daily after supper.    [provider]  TURMERIC PO Take 1 tablet by mouth.    [provider]  Zinc 50 MG TABS Take by mouth daily.    [provider]    Family History Family History  Problem Relation Age of Onset   Hyperlipidemia Mother    Heart disease Father 59   Alzheimer's disease Father    Heart disease Brother    Diabetes Brother    Colon cancer Maternal Uncle    Colon cancer Other    Colon polyps Neg Hx    Esophageal cancer Neg Hx    Rectal cancer Neg Hx    Stomach cancer Neg Hx     Social History Social History   Tobacco Use   Smoking status: Never   Smokeless tobacco: Never  Vaping Use   Vaping Use: Never used  Substance Use Topics   Alcohol use: Yes    Comment: occasionally    Drug use: No     Allergies   Penicillins   Review of Systems Review of Systems Per HPI  Physical Exam Triage Vital Signs ED Triage Vitals  Enc Vitals Group     BP 04/14/21 0900 115/69     Pulse Rate 04/14/21 0900 76     Resp 04/14/21 0900 14     Temp 04/14/21 0900 (!) 97.3 F (36.3 C)     Temp Source 04/14/21 0900 Temporal     SpO2 04/14/21 0900 97 %     Weight --      Height --      Head Circumference --      Peak Flow --      Pain Score 04/14/21 0901 0     Pain Loc --      Pain Edu? --      Excl. in [place]? --    No data found.  Updated Vital Signs BP 115/69    Pulse 76    Temp (!) 97.3 F (36.3 C) (Temporal)  Resp 14     SpO2 97%   Visual Acuity Right Eye Distance:   Left Eye Distance:   Bilateral Distance:    Right Eye Near:   Left Eye Near:    Bilateral Near:     Physical Exam Vitals and nursing note reviewed.  Constitutional:      Appearance: Normal appearance.  HENT:     Head: Atraumatic.     Nose: Nose normal.     Mouth/Throat:     Mouth: Mucous membranes are moist.  Eyes:     Extraocular Movements: Extraocular movements intact.     Conjunctiva/sclera: Conjunctivae normal.  Cardiovascular:     Rate and Rhythm: Normal rate and regular rhythm.  Pulmonary:     Effort: Pulmonary effort is normal.     Breath sounds: Normal breath sounds.  Abdominal:     General: Bowel sounds are normal. There is no distension.     Palpations: Abdomen is soft.     Tenderness: There is no abdominal tenderness. There is no right CVA tenderness, left CVA tenderness or guarding.  Musculoskeletal:        General: Normal range of motion.     Cervical back: Normal range of motion and neck supple.  Skin:    General: Skin is warm and dry.  Neurological:     General: No focal deficit present.     Mental Status: He is oriented to person, place, and time.  Psychiatric:        Mood and Affect: Mood normal.        Thought Content: Thought content normal.        Judgment: Judgment normal.     UC Treatments / Results  Labs (all labs ordered are listed, but only abnormal results are displayed) Labs Reviewed - No data to display  EKG   Radiology No results found.  Procedures Procedures (including critical care time)  Medications Ordered in UC Medications - No data to display  Initial Impression / Assessment and Plan / UC Course  I have reviewed the triage vital signs and the nursing notes.  Pertinent labs & imaging results that were available during my care of the patient were reviewed by me and considered in my medical decision making (see chart for details).     Vital signs and exam benign and  reassuring and per patient symptoms are all but resolved this morning.  Continue Imodium, brat diet, fluids and close monitoring.  Return for acutely worsening symptoms.  Suspect viral GI illness causing initial symptoms.  Final Clinical Impressions(s) / UC Diagnoses   Final diagnoses:  Abdominal cramping  Diarrhea, unspecified type   Discharge Instructions   None    ED Prescriptions   None    PDMP not reviewed this encounter.   Merrie Roof [place], [place] 04/14/21 [phone]

## 2021-05-14 IMAGING — CT CT HEART SCORING
2 series · 16 of 20 positions shown, 18 images · non-contrast
Comparison: None.
COMPARISON: None.

Addendum:
EXAM:
OVER-READ INTERPRETATION  CT CHEST

The following report is an over-read performed by radiologist Dr.
Kaltii Fardi [REDACTED] on 02/22/2019. This
over-read does not include interpretation of cardiac or coronary
anatomy or pathology. The coronary calcium score interpretation by
the cardiologist is attached.
CLINICAL DATA: Risk stratification
Coronary Calcium Score
TECHNIQUE: The patient was scanned on a Siemens Force scanner. Axial
non-contrast 3 mm slices were carried out through the heart. The
data set was analyzed on a dedicated work station and scored using
the Agatson method.

[Series 2: casc 3.0 i36f 2 bestdiast 70 % · axial · 0.41mm/px · z∈[-235,-130]mm · 8 of 46 slices shown, 10 images]
[im 6/46  vessel]
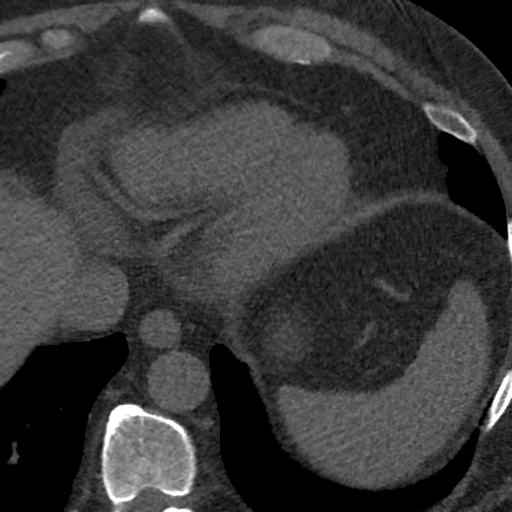
[im 6/46  lung]
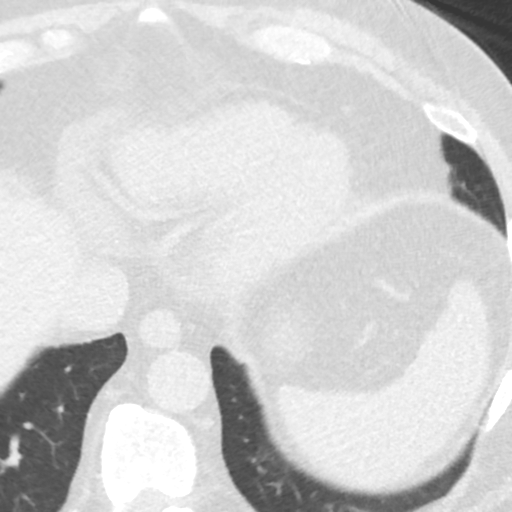
[im 11/46  vessel]
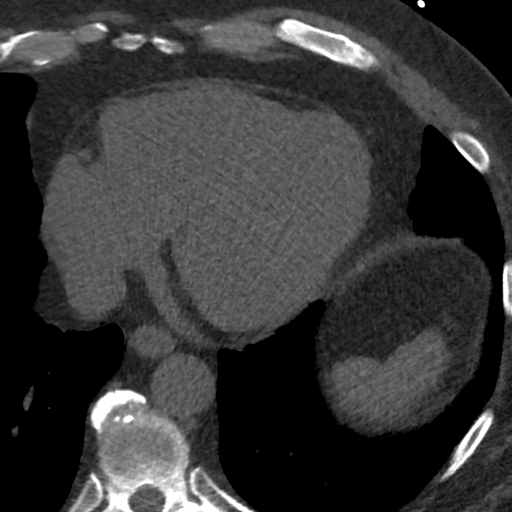
[im 16/46  vessel]
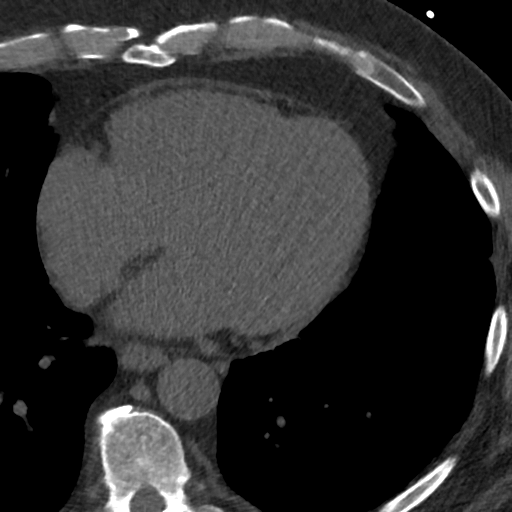
[im 21/46  vessel]
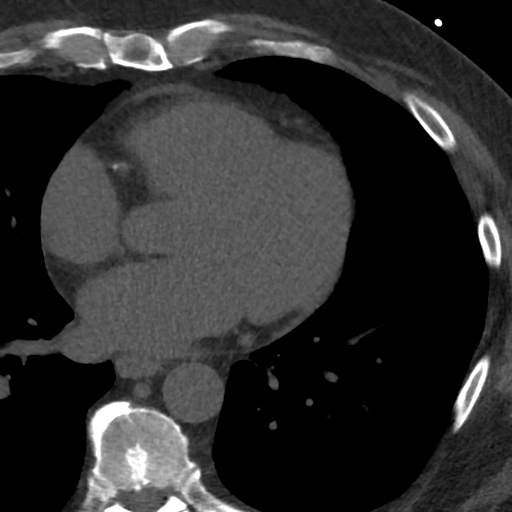
[im 26/46  vessel]
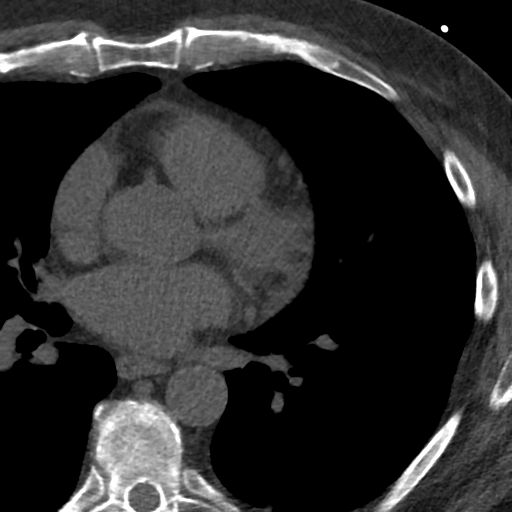
[im 26/46  lung]
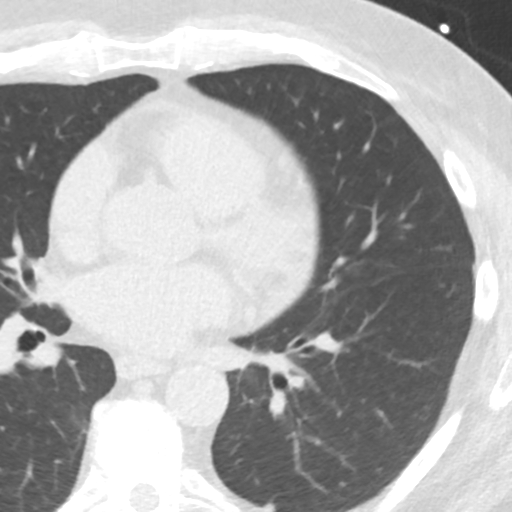
[im 31/46  vessel]
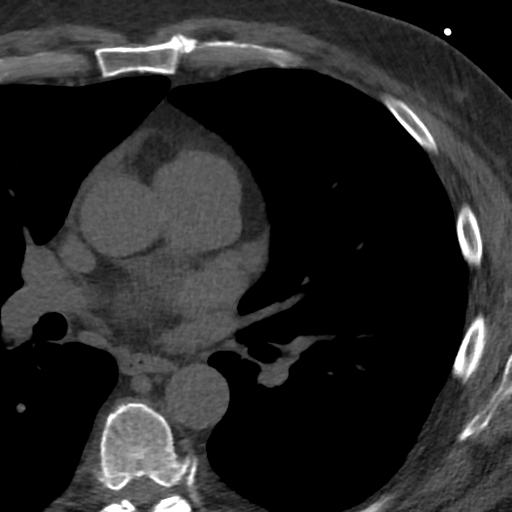
[im 36/46  vessel]
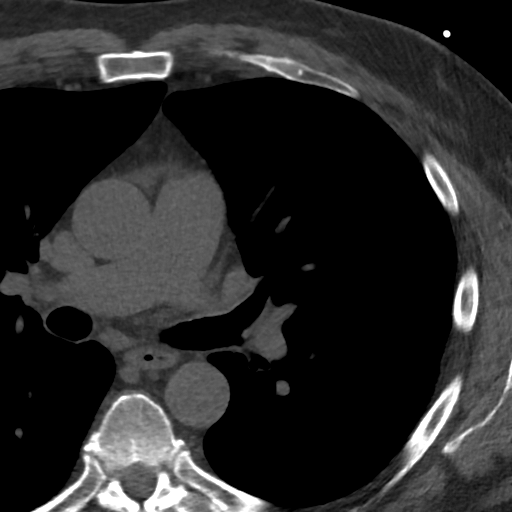
[im 41/46  vessel]
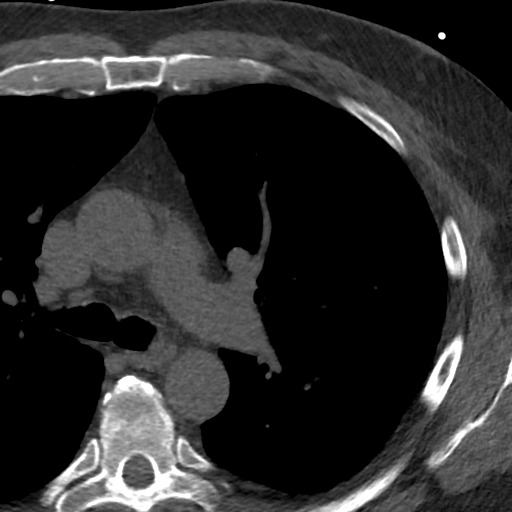

[Series 4: lung st 69 % · axial · 0.74mm/px · z∈[-234,-130]mm · 8 of 46 slices shown]
[im 6/46  lung]
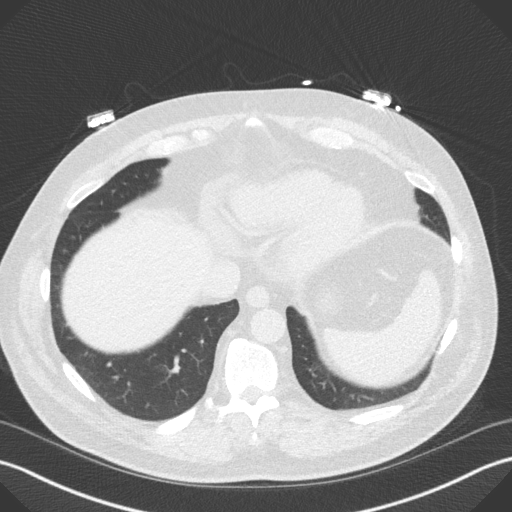
[im 11/46  lung]
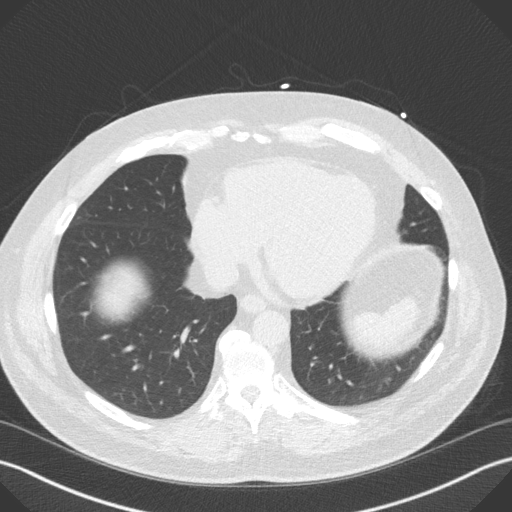
[im 16/46  lung]
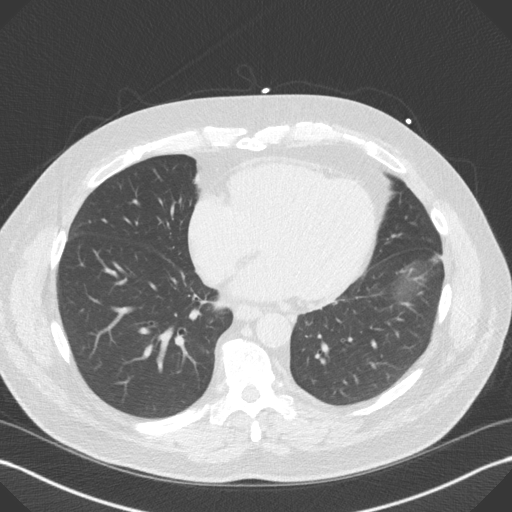
[im 21/46  lung]
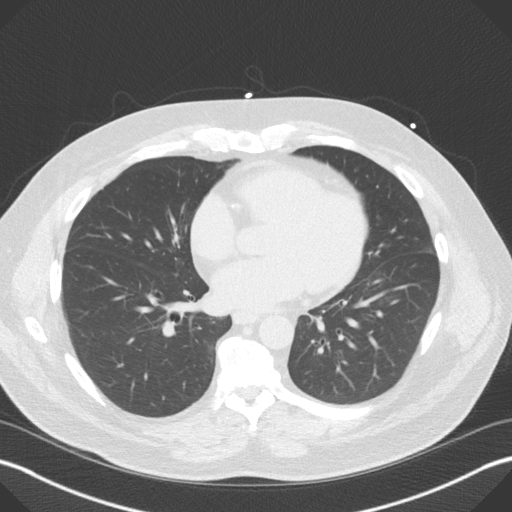
[im 26/46  lung]
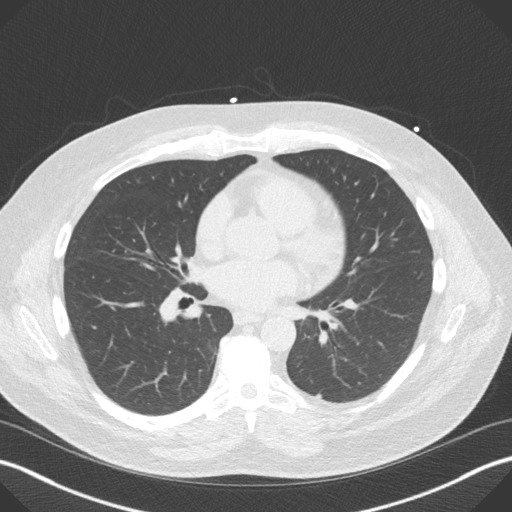
[im 31/46  lung]
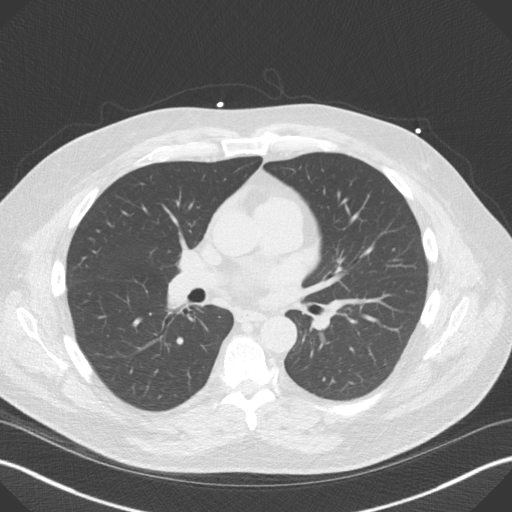
[im 36/46  lung]
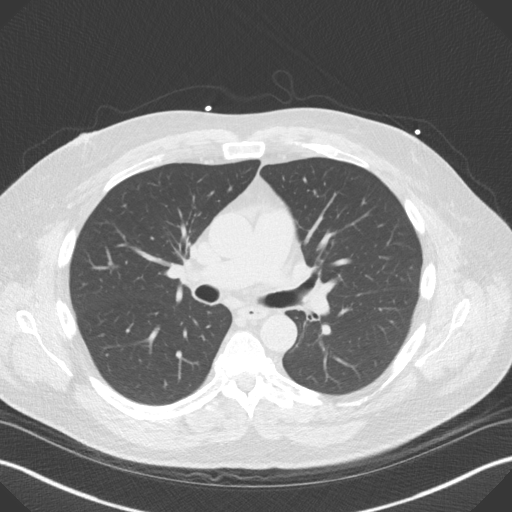
[im 41/46  lung]
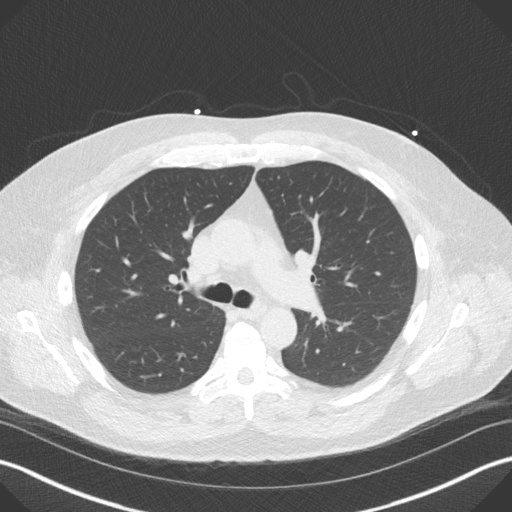

[16 of 20 positions shown; findings below may reference images not displayed]

FINDINGS: Vascular: Heart is normal size. Aorta is normal caliber. Scattered
punctate calcifications in the aortic root and descending thoracic
aorta.

Mediastinum/Nodes: No adenopathy in the lower mediastinum or hila.

Lungs/Pleura: Visualized lungs clear.  No effusions.

Upper Abdomen: Imaging into the upper abdomen shows no acute
findings.

Musculoskeletal: Chest wall soft tissues are unremarkable. No acute
bony abnormality.
IMPRESSION: No acute extra cardiac abnormality.

Scattered aortic atherosclerosis.
FINDINGS: Non-cardiac: See separate report from [REDACTED].

Ascending Aorta: Normal size, minimal calcifications.

Pericardium: Normal.

Coronary arteries: Normal origin.
IMPRESSION: Coronary calcium score of 5.5. This was 23 percentile for age and
sex matched control.

*** End of Addendum ***
EXAM:
OVER-READ INTERPRETATION  CT CHEST

The following report is an over-read performed by radiologist Dr.
Kaltii Fardi [REDACTED] on 02/22/2019. This
over-read does not include interpretation of cardiac or coronary
anatomy or pathology. The coronary calcium score interpretation by
the cardiologist is attached.
FINDINGS: Vascular: Heart is normal size. Aorta is normal caliber. Scattered
punctate calcifications in the aortic root and descending thoracic
aorta.

Mediastinum/Nodes: No adenopathy in the lower mediastinum or hila.

Lungs/Pleura: Visualized lungs clear.  No effusions.

Upper Abdomen: Imaging into the upper abdomen shows no acute
findings.

Musculoskeletal: Chest wall soft tissues are unremarkable. No acute
bony abnormality.
IMPRESSION: No acute extra cardiac abnormality.

Scattered aortic atherosclerosis.

## 2021-05-31 ENCOUNTER — Ambulatory Visit (AMBULATORY_SURGERY_CENTER): Payer: Self-pay

## 2021-05-31 ENCOUNTER — Other Ambulatory Visit: Payer: Self-pay

## 2021-05-31 VITALS — Ht 68.0 in | Wt 175.0 lb

## 2021-05-31 DIAGNOSIS — Z86010 Personal history of colonic polyps: Secondary | ICD-10-CM

## 2021-05-31 MED ORDER — PEG 3350-KCL-NA BICARB-NACL 420 G PO SOLR: 4000.0000 mL | Freq: Once | ORAL | 0 refills | Status: AC

## 2021-05-31 NOTE — Progress Notes (Signed)
Denies allergies to eggs or soy products. Denies complication of anesthesia or sedation. Denies use of weight loss medication. Denies use of O2.   Emmi instructions given for colonoscopy.  

## 2021-06-07 ENCOUNTER — Encounter: Payer: Medicare HMO | Admitting: Internal Medicine

## 2021-06-13 ENCOUNTER — Encounter: Payer: Self-pay | Admitting: Internal Medicine

## 2021-06-14 ENCOUNTER — Encounter: Payer: Self-pay | Admitting: Internal Medicine

## 2021-06-14 ENCOUNTER — Other Ambulatory Visit: Payer: Self-pay

## 2021-06-14 ENCOUNTER — Ambulatory Visit (AMBULATORY_SURGERY_CENTER): Payer: Medicare HMO | Admitting: Internal Medicine

## 2021-06-14 VITALS — BP 122/66 | HR 62 | Temp 97.3°F | Resp 13 | Ht 68.0 in | Wt 175.0 lb

## 2021-06-14 DIAGNOSIS — Z86010 Personal history of colonic polyps: Secondary | ICD-10-CM | POA: Diagnosis not present

## 2021-06-14 DIAGNOSIS — D120 Benign neoplasm of cecum: Secondary | ICD-10-CM

## 2021-06-14 MED ORDER — SODIUM CHLORIDE 0.9 % IV SOLN: 500.0000 mL | INTRAVENOUS | Status: DC

## 2021-06-14 NOTE — Progress Notes (Signed)
Called to room to assist during endoscopic procedure.  Patient ID and intended procedure confirmed with present staff. Received instructions for my participation in the procedure from the performing physician.  

## 2021-06-14 NOTE — Op Note (Signed)
[facility] Patient Name: Charles [name] Procedure Date: 06/14/2021 10:38 AM MRN: [id number] Endoscopist: Jerene Bears , MD Age: 71 Referring MD:  Date of Birth: Sep 28, 1950 Gender: Male Account #: [id number] Procedure:                Colonoscopy Indications:              High risk colon cancer surveillance: Personal                            history of multiple adenomas, Last colonoscopy:                            January 2020 (5 adenomas removed) Medicines:                Monitored Anesthesia Care Procedure:                Pre-Anesthesia Assessment:                           - Prior to the procedure, a History and Physical                            was performed, and patient medications and                            allergies were reviewed. The patient's tolerance of                            previous anesthesia was also reviewed. The risks                            and benefits of the procedure and the sedation                            options and risks were discussed with the patient.                            All questions were answered, and informed consent                            was obtained. Prior Anticoagulants: The patient has                            taken no previous anticoagulant or antiplatelet                            agents. ASA Grade Assessment: II - A patient with                            mild systemic disease. After reviewing the risks                            and benefits, the patient was deemed in  satisfactory condition to undergo the procedure.                           After obtaining informed consent, the colonoscope                            was passed under direct vision. Throughout the                            procedure, the patient's blood pressure, pulse, and                            oxygen saturations were monitored continuously. The                            CF [id number] #[id number] was introduced  through the anus                            and advanced to the cecum, identified by the                            ileocecal valve. The colonoscopy was performed                            without difficulty. The patient tolerated the                            procedure well. The quality of the bowel                            preparation was good. The ileocecal valve,                            appendiceal orifice, and rectum were photographed. Scope In: 10:54:13 AM Scope Out: 11:10:17 AM Scope Withdrawal Time: 0 hours 11 minutes 51 seconds  Total Procedure Duration: 0 hours 16 minutes 4 seconds  Findings:                 The digital rectal exam was normal.                           Two sessile polyps were found in the cecum. The                            polyps were 4 to 5 mm in size. These polyps were                            removed with a cold snare. Resection and retrieval                            were complete.                           Internal hemorrhoids were found during  retroflexion. The hemorrhoids were small.                           The exam was otherwise without abnormality. Complications:            No immediate complications. Estimated Blood Loss:     Estimated blood loss was minimal. Impression:               - Two 4 to 5 mm polyps in the cecum, removed with a                            cold snare. Resected and retrieved.                           - Small internal hemorrhoids.                           - The examination was otherwise normal. Recommendation:           - Patient has a contact number available for                            emergencies. The signs and symptoms of potential                            delayed complications were discussed with the                            patient. Return to normal activities tomorrow.                            Written discharge instructions were provided to the                             patient.                           - Resume previous diet.                           - Continue present medications.                           - Await pathology results.                           - Repeat colonoscopy in 5 years for surveillance. Jerene Bears, MD 06/14/2021 11:12:11 AM This report has been signed electronically.

## 2021-06-14 NOTE — Progress Notes (Signed)
To Pacu, VSS. Report to [email or web address] 

## 2021-06-14 NOTE — Progress Notes (Signed)
GASTROENTEROLOGY PROCEDURE H&P NOTE   Primary Care Physician: Sharilyn Sites, MD    Reason for Procedure:  Personal history of adenomatous polyps  Plan:    Surveillance colonoscopy  Patient is appropriate for endoscopic procedure(s) in the ambulatory ([facility]) setting.  The nature of the procedure, as well as the risks, benefits, and alternatives were carefully and thoroughly reviewed with the patient. Ample time for discussion and questions allowed. The patient understood, was satisfied, and agreed to proceed.     HPI: Charles [name] is a 71 y.o. male who presents for colonoscopy.  Medical history as below.  Tolerated the prep.  No recent chest pain or shortness of breath.  No abdominal pain today.  Past Medical History:  Diagnosis Date   Allergy    Asthma    Hyperlipidemia    well controlled   PONV (postoperative nausea and vomiting)    Sleep apnea    had surgery to correct - no cpap    Sleep apnea, central 05/31/2009   NOCTURNAL POLYSOMNOGRAM -(DR Constance Holster, ENT) MILD SLEEP APNEA SYNDROME ,MILD HYPOVENTILATION ; ABN SLEEP ARCHITECTURE WITH POOR SLEEP EFFICIENCY AND REDUCED SLOW WAVE SLEEP RESULTS AN INCREASESD STAGE 2 SLEEP. ;; REC 2 L QHS O2 - No longer on CPAP - Had ENT surgery in 2585   Umbilical hernia 27/78/2423   ASYMPTOMATIC WITH NO GI  SYMPTOMS   Ventral hernia 11/30/2012   ASYMPTOMATIC    Past Surgical History:  Procedure Laterality Date   BACK SURGERY     CARPAL TUNNEL RELEASE Bilateral    COLONOSCOPY     x 2- normal    DOPPLER ECHOCARDIOGRAPHY  09/08/2011   EF =>53% ,LV SYSTOLIC FUNCTION NORMAL    FOOT SURGERY Right 1985   HERNIA REPAIR     LUMBAR LAMINECTOMY  1970   NASAL SEPTUM SURGERY  1970   SHOULDER SURGERY Bilateral    sleep apnea surgery     removal of soft palate, septoplasty #2, tonsillectomy    TONSILLECTOMY  10/11/2008   DR Constance Holster-- with uvulopalatophrayngoplasty,revision nasal septolasty   TONSILLECTOMY     TREADMILL EXERCISE STRESS TEST   01/01/2012; 07/2013   a) 9/'13:NORMOTENSIVE REPONSE TO EXERCISE; GOOD EXERCISE TOLERANCE;NO ECG evidence of exercise-induced ischemia; rare PVCs;; b) 4/'15: Non-ischemic, EF 61%   UMBILICAL HERNIA REPAIR      Prior to Admission medications   Medication Sig Start Date End Date Taking? Authorizing Provider  Ascorbic Acid (VITAMIN C PO) Take 1,000 mg by mouth.   Yes [provider]  aspirin EC 81 MG tablet Take 81 mg by mouth daily.   Yes [provider]  BIOTIN PO Take 1 tablet by mouth as needed.   Yes [provider]  Calcium Carbonate-Vit D-Min (CALCIUM 1200 PO) Take by mouth.   Yes [provider]  Cholecalciferol (VITAMIN D3) 400 UNITS CAPS Take by mouth.   Yes [provider]  Coenzyme Q10 (CO Q 10 PO) Take 30 mg by mouth daily.   Yes [provider]  Cyanocobalamin (VITAMIN B 12 PO) Take 1,000 mg by mouth daily.   Yes [provider]  fenofibrate 160 MG tablet Take 160 mg by mouth daily.   Yes [provider]  folic acid (FOLVITE) 1 MG tablet Take 1 mg by mouth daily.   Yes [provider]  Glucosamine HCl (GLUCOSAMINE PO) Take by mouth.   Yes [provider]  Magnesium 200 MG TABS Take by mouth.   Yes [provider]  Melatonin 5 MG TABS Take 5 mg by mouth daily.   Yes [provider]  meloxicam (MOBIC) 15 MG tablet Take 15 mg by mouth as needed.  07/16/15  Yes [provider]  Multiple Vitamin (MULTIVITAMIN) tablet Take 1 tablet by mouth daily.   Yes [provider]  Omega-3 Fatty Acids (FISH OIL PO) Take by mouth. Take 2 caps in morning and 1 caps in evening   Yes [provider]  Psyllium (KONSYL DAILY FIBER PO) Take by mouth 2 (two) times daily after a meal.   Yes [provider]  simvastatin (ZOCOR) 20 MG tablet Take 20 mg by mouth daily.   Yes [provider]  tamsulosin (FLOMAX) 0.4 MG CAPS capsule Take 0.4 mg by mouth daily after  supper.   Yes [provider]  TURMERIC PO Take 1 tablet by mouth.   Yes [provider]  ibuprofen (ADVIL,MOTRIN) 200 MG tablet Take 200 mg by mouth every 6 (six) hours as needed.    [provider]  sildenafil (REVATIO) 20 MG tablet Take 20 mg by mouth as needed.    [provider]    Current Outpatient Medications  Medication Sig Dispense Refill   Ascorbic Acid (VITAMIN C PO) Take 1,000 mg by mouth.     aspirin EC 81 MG tablet Take 81 mg by mouth daily.     BIOTIN PO Take 1 tablet by mouth as needed.     Calcium Carbonate-Vit D-Min (CALCIUM 1200 PO) Take by mouth.     Cholecalciferol (VITAMIN D3) 400 UNITS CAPS Take by mouth.     Coenzyme Q10 (CO Q 10 PO) Take 30 mg by mouth daily.     Cyanocobalamin (VITAMIN B 12 PO) Take 1,000 mg by mouth daily.     fenofibrate 160 MG tablet Take 160 mg by mouth daily.     folic acid (FOLVITE) 1 MG tablet Take 1 mg by mouth daily.     Glucosamine HCl (GLUCOSAMINE PO) Take by mouth.     Magnesium 200 MG TABS Take by mouth.     Melatonin 5 MG TABS Take 5 mg by mouth daily.     meloxicam (MOBIC) 15 MG tablet Take 15 mg by mouth as needed.   0   Multiple Vitamin (MULTIVITAMIN) tablet Take 1 tablet by mouth daily.     Omega-3 Fatty Acids (FISH OIL PO) Take by mouth. Take 2 caps in morning and 1 caps in evening     Psyllium (KONSYL DAILY FIBER PO) Take by mouth 2 (two) times daily after a meal.     simvastatin (ZOCOR) 20 MG tablet Take 20 mg by mouth daily.     tamsulosin (FLOMAX) 0.4 MG CAPS capsule Take 0.4 mg by mouth daily after supper.     TURMERIC PO Take 1 tablet by mouth.     ibuprofen (ADVIL,MOTRIN) 200 MG tablet Take 200 mg by mouth every 6 (six) hours as needed.     sildenafil (REVATIO) 20 MG tablet Take 20 mg by mouth as needed.     Current Facility-Administered Medications  Medication Dose Route Frequency Provider Last Rate Last Admin   0.9 %  sodium chloride infusion  500 mL Intravenous Continuous  Carlus Stay, Lajuan Lines, MD        Allergies as of 06/14/2021 - Review Complete 06/14/2021  Allergen Reaction Noted   Penicillins Rash 07/19/2013    Family History  Problem Relation Age of Onset   Hyperlipidemia Mother  Heart disease Father 51   Alzheimer's disease Father    Heart disease Brother    Diabetes Brother    Colon cancer Maternal Uncle    Colon cancer Other    Colon polyps Neg Hx    Esophageal cancer Neg Hx    Rectal cancer Neg Hx    Stomach cancer Neg Hx     Social History   Socioeconomic History   Marital status: Married    Spouse name: Not on file   Number of children: Not on file   Years of education: Not on file   Highest education level: Not on file  Occupational History   Not on file  Tobacco Use   Smoking status: Never   Smokeless tobacco: Never  Vaping Use   Vaping Use: Never used  Substance and Sexual Activity   Alcohol use: Yes    Comment: occasionally    Drug use: No   Sexual activity: Not on file  Other Topics Concern   Not on file  Social History Narrative   Married father of 2 with 1 grandchild. 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   He does not do any regular walking.  He is to use his home workout machine, but now is noted that he really hasn't had the same amount of time at the end of the day the use to. Using working from 6 AM to 7 PM.   Social Determinants of Health   Financial Resource Strain: Not on file  Food Insecurity: Not on file  Transportation Needs: Not on file  Physical Activity: Not on file  Stress: Not on file  Social Connections: Not on file  Intimate Partner Violence: Not on file    Physical Exam: Vital signs in last 24 hours: @BP  123/61    Pulse 76    Temp (!) 97.3 F (36.3 C) (Temporal)    Ht 5\' 8"  (1.727 m)    Wt 175 lb (79.4 kg)     SpO2 99%    BMI 26.61 kg/m  GEN: NAD EYE: Sclerae anicteric ENT: MMM CV: Non-tachycardic Pulm: CTA b/l GI: Soft, NT/ND NEURO:  Alert & Oriented x 3   Zenovia Jarred, MD [facility]  06/14/2021 10:43 AM

## 2021-06-14 NOTE — Patient Instructions (Signed)
Handout on polyps given. ° °YOU HAD AN ENDOSCOPIC PROCEDURE TODAY AT THE [facility]:   Refer to the procedure report that was given to you for any specific questions about what was found during the examination.  If the procedure report does not answer your questions, please call your gastroenterologist to clarify.  If you requested that your care partner not be given the details of your procedure findings, then the procedure report has been included in a sealed envelope for you to review at your convenience later. ° °YOU SHOULD EXPECT: Some feelings of bloating in the abdomen. Passage of more gas than usual.  Walking can help get rid of the air that was put into your GI tract during the procedure and reduce the bloating. If you had a lower endoscopy (such as a colonoscopy or flexible sigmoidoscopy) you may notice spotting of blood in your stool or on the toilet paper. If you underwent a bowel prep for your procedure, you may not have a normal bowel movement for a few days. ° °Please Note:  You might notice some irritation and congestion in your nose or some drainage.  This is from the oxygen used during your procedure.  There is no need for concern and it should clear up in a day or so. ° °SYMPTOMS TO REPORT IMMEDIATELY: ° °Following lower endoscopy (colonoscopy or flexible sigmoidoscopy): ° Excessive amounts of blood in the stool ° Significant tenderness or worsening of abdominal pains ° Swelling of the abdomen that is new, acute ° Fever of 100°F or higher ° °For urgent or emergent issues, a gastroenterologist can be reached at any hour by calling [phone]. °Do not use MyChart messaging for urgent concerns.  ° ° °DIET:  We do recommend a small meal at first, but then you may proceed to your regular diet.  Drink plenty of fluids but you should avoid alcoholic beverages for 24 hours. ° °ACTIVITY:  You should plan to take it easy for the rest of today and you should NOT DRIVE or use heavy machinery  until tomorrow (because of the sedation medicines used during the test).   ° °FOLLOW UP: °Our staff will call the number listed on your records 48-72 hours following your procedure to check on you and address any questions or concerns that you may have regarding the information given to you following your procedure. If we do not reach you, we will leave a message.  We will attempt to reach you two times.  During this call, we will ask if you have developed any symptoms of COVID 19. If you develop any symptoms (ie: fever, flu-like symptoms, shortness of breath, cough etc.) before then, please call [phone].  If you test positive for Covid 19 in the 2 weeks post procedure, please call and report this information to us.   ° °If any biopsies were taken you will be contacted by phone or by letter within the next 1-3 weeks.  Please call us at [phone] if you have not heard about the biopsies in 3 weeks.  ° ° °SIGNATURES/CONFIDENTIALITY: °You and/or your care partner have signed paperwork which will be entered into your electronic medical record.  These signatures attest to the fact that that the information above on your After Visit Summary has been reviewed and is understood.  Full responsibility of the confidentiality of this discharge information lies with you and/or your care-partner.  °

## 2021-06-14 NOTE — Progress Notes (Signed)
Pt's states no medical or surgical changes since previsit or office visit. 

## 2021-06-19 ENCOUNTER — Encounter: Payer: Self-pay | Admitting: Internal Medicine

## 2021-06-19 ENCOUNTER — Telehealth: Payer: Self-pay

## 2021-06-19 NOTE — Telephone Encounter (Signed)
°  Follow up Call-  Call back number 06/14/2021  Post procedure Call Back phone  # [phone]  Permission to leave phone message Yes  Some recent data might be hidden     Patient questions:  Do you have a fever, pain , or abdominal swelling? No. Pain Score  0 *  Have you tolerated food without any problems? Yes.    Have you been able to return to your normal activities? Yes.    Do you have any questions about your discharge instructions: Diet   No. Medications  No. Follow up visit  No.  Do you have questions or concerns about your Care? No.  Actions: * If pain score is 4 or above: No action needed, pain <4.  Have you developed a fever since your procedure? no  2.   Have you had an respiratory symptoms (SOB or cough) since your procedure? no  3.   Have you tested positive for COVID 19 since your procedure no  4.   Have you had any family members/close contacts diagnosed with the COVID 19 since your procedure?  no   If yes to any of these questions please route to Joylene John, RN and Joella Prince, RN

## 2021-07-16 DIAGNOSIS — Z0100 Encounter for examination of eyes and vision without abnormal findings: Secondary | ICD-10-CM | POA: Diagnosis not present

## 2021-07-16 DIAGNOSIS — E7800 Pure hypercholesterolemia, unspecified: Secondary | ICD-10-CM | POA: Diagnosis not present

## 2021-07-16 DIAGNOSIS — H5200 Hypermetropia, unspecified eye: Secondary | ICD-10-CM | POA: Diagnosis not present

## 2021-07-26 ENCOUNTER — Encounter: Payer: Self-pay | Admitting: *Deleted

## 2021-08-20 ENCOUNTER — Other Ambulatory Visit (INDEPENDENT_AMBULATORY_CARE_PROVIDER_SITE_OTHER): Payer: Medicare HMO

## 2021-08-20 ENCOUNTER — Encounter: Payer: Self-pay | Admitting: Internal Medicine

## 2021-08-20 ENCOUNTER — Ambulatory Visit: Payer: Medicare HMO | Admitting: Internal Medicine

## 2021-08-20 VITALS — BP 118/60 | HR 86 | Ht 66.0 in | Wt 183.4 lb

## 2021-08-20 DIAGNOSIS — Z86010 Personal history of colonic polyps: Secondary | ICD-10-CM

## 2021-08-20 DIAGNOSIS — K5900 Constipation, unspecified: Secondary | ICD-10-CM | POA: Diagnosis not present

## 2021-08-20 DIAGNOSIS — K5904 Chronic idiopathic constipation: Secondary | ICD-10-CM

## 2021-08-20 LAB — CBC WITH DIFFERENTIAL/PLATELET
Basophils Absolute: 0 10*3/uL (ref 0.0–0.1)
Basophils Relative: 1.1 % (ref 0.0–3.0)
Eosinophils Absolute: 0.1 10*3/uL (ref 0.0–0.7)
Eosinophils Relative: 2.4 % (ref 0.0–5.0)
HCT: 46.7 % (ref 39.0–52.0)
Hemoglobin: 15.6 g/dL (ref 13.0–17.0)
Lymphocytes Relative: 20.4 % (ref 12.0–46.0)
Lymphs Abs: 0.8 10*3/uL (ref 0.7–4.0)
MCHC: 33.4 g/dL (ref 30.0–36.0)
MCV: 87.9 fl (ref 78.0–100.0)
Monocytes Absolute: 0.3 10*3/uL (ref 0.1–1.0)
Monocytes Relative: 8.5 % (ref 3.0–12.0)
Neutro Abs: 2.6 10*3/uL (ref 1.4–7.7)
Neutrophils Relative %: 67.6 % (ref 43.0–77.0)
Platelets: 195 10*3/uL (ref 150.0–400.0)
RBC: 5.31 Mil/uL (ref 4.22–5.81)
RDW: 13.4 % (ref 11.5–15.5)
WBC: 3.9 10*3/uL — ABNORMAL LOW (ref 4.0–10.5)

## 2021-08-20 LAB — COMPREHENSIVE METABOLIC PANEL
ALT: 17 U/L (ref 0–53)
AST: 22 U/L (ref 0–37)
Albumin: 4.4 g/dL (ref 3.5–5.2)
Alkaline Phosphatase: 38 U/L — ABNORMAL LOW (ref 39–117)
BUN: 14 mg/dL (ref 6–23)
CO2: 27 mEq/L (ref 19–32)
Calcium: 9.6 mg/dL (ref 8.4–10.5)
Chloride: 103 mEq/L (ref 96–112)
Creatinine, Ser: 0.89 mg/dL (ref 0.40–1.50)
GFR: 87.58 mL/min (ref >60.00)
Glucose, Bld: 79 mg/dL (ref 70–99)
Potassium: 4.3 mEq/L (ref 3.5–5.1)
Sodium: 138 mEq/L (ref 135–145)
Total Bilirubin: 0.6 mg/dL (ref 0.2–1.2)
Total Protein: 7.1 g/dL (ref 6.0–8.3)

## 2021-08-20 LAB — MAGNESIUM: Magnesium: 1.8 mg/dL (ref 1.5–2.5)

## 2021-08-20 LAB — TSH: TSH: 0.76 u[IU]/mL (ref 0.35–5.50)

## 2021-08-20 LAB — VITAMIN B12: Vitamin B-12: 481 pg/mL (ref 211–911)

## 2021-08-20 LAB — T3: T3, Total: 96 ng/dL (ref 76–181)

## 2021-08-20 NOTE — Progress Notes (Signed)
? ?Subjective:  ? ? Patient ID: Charles [name], male    DOB: [birth year], 71 y.o.   MRN: [id number] ? ?HPI ?Yaniv Lage is a 71 year old male with a past medical history of adenomatous colon polyps, hyperlipidemia, sleep apnea and recent issues with constipation who is seen in follow-up after colonoscopy to further discuss constipation as well as polyp surveillance plans.  He is here alone today. ? ?He had colonoscopy performed by me on 06/14/2021.  This was a complete procedure with good prep.  There were 2 polyps removed from the cecum with the largest being 5 mm.  These were adenomatous without high-grade dysplasia.  There were small internal hemorrhoids and the exam was otherwise normal. ? ?He reports that over the years he has had increasing issues with constipation.  For him this is hard stool but also less frequency and less he takes supplements.  He has avoided his statin since colonoscopy but is seen no real change in bowel pattern.  He is going to resume statin therapy soon.  He is using fiber previously Konsyl with honey and vinegar but he has since changed to Metamucil.  He is also using Colace 100 mg 2 to 3 capsules daily.  He is using magnesium product as well as melatonin at night but this is more for sleep and muscle cramp and spasm.  With this regimen he is typically having a bowel movement daily though it is more often hard to than not.  No blood in stool or melena.  No abdominal pain.  He has avoided processed meats and eats a relatively low meat diet with more focus on chicken than red meat or pork. ? 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 ? ? ?Review of Systems ?As per HPI, otherwise negative ? ?Current Medications, Allergies, Past Medical History, Past Surgical History, Family History and Social History were reviewed in [facility] record. ?   ?Objective:  ? Physical Exam ?BP 118/60   Pulse 86   Ht '5\' 6"'$  (1.676  m) Comment: height measured without shoes  Wt 183 lb 6 oz (83.2 kg)   BMI 29.60 kg/m?  ?Gen: awake, alert, NAD ?HEENT: anicteric ?Neuro: nonfocal ? ? ? ?   ?Assessment & Plan:  ?71 year old male with a past medical history of adenomatous colon polyps, hyperlipidemia, sleep apnea and recent issues with constipation who is seen in follow-up after colonoscopy to further discuss constipation as well as polyp surveillance plans.  ? ?Constipation --chronic at this point.  We discussed constipation at length.  He is staying hydrated as well as using a fiber supplement.  He is also using magnesium and Colace.  He recently traveled to visit his sister in [place] and used a different magnesium supplement which seem to make bowel movements much easier and slightly more frequent.  We discussed that he try this product as a substitute for the magnesium he is taking.  If not we can try pharmacologic therapy.  I also recommended checking lab work to exclude other causes of slower bowel function ?--CBC, CMP, magnesium, TSH and B12 ?--Checking vitamin D3 as well ?--Continue daily fiber supplement ?--He will try over-the-counter magnesium product he recently used on recommendation from his sister.  If this is effective he can continue this therapy. ?--If not effective I would recommend we try Trulance 3 mg daily.  I told him that if magnesium supplement is no longer effective  to send me a MyChart message and we could provide 7 to 10 days of Trulance samples to see if this is effective. ? ?2.  History of adenomatous colon polyps --we discussed surveillance intervals today.  We also discussed the importance of a high-fiber and low processed meat diet.  He does not use tobacco. ?--5-year surveillance interval recommended which she is comfortable with; repeat colonoscopy around February 2028 ? ?40 minutes total spent today including patient facing time, coordination of care, reviewing medical history/procedures/pertinent radiology  studies, and documentation of the encounter. ? ? ?

## 2021-08-20 NOTE — Patient Instructions (Signed)
Your provider has requested that you go to the basement level for lab work before leaving today. Press "B" on the elevator. The lab is located at the first door on the left as you exit the elevator. ? ?Please purchase the following medications over the counter and take as directed: ?Magnesium ? ?Continue your current fiber supplement. ? ?If the regimen of magnesium supplements and fiber supplements do not improve your constipation, please send a mychart message and we will further assess. ? ?If you are age 45 or older, your body mass index should be between 23-30. Your Body mass index is 29.6 kg/m?Marland Kitchen If this is out of the aforementioned range listed, please consider follow up with your Primary Care Provider. ? ?If you are age 79 or younger, your body mass index should be between 19-25. Your Body mass index is 29.6 kg/m?Marland Kitchen If this is out of the aformentioned range listed, please consider follow up with your Primary Care Provider.  ? ?________________________________________________________ ? ?The [facility] providers would like to encourage you to use [facility] to communicate with providers for non-urgent requests or questions.  Due to long hold times on the telephone, sending your provider a message by [facility] may be a faster and more efficient way to get a response.  Please allow 48 business hours for a response.  Please remember that this is for non-urgent requests.  ?_______________________________________________________ ? ?Due to recent changes in healthcare laws, you may see the results of your imaging and laboratory studies on MyChart before your provider has had a chance to review them.  We understand that in some cases there may be results that are confusing or concerning to you. Not all laboratory results come back in the same time frame and the provider may be waiting for multiple results in order to interpret others.  Please give [place] 48 hours in order for your provider to thoroughly review all the results  before contacting the office for clarification of your results.  ? ?

## 2021-09-04 DIAGNOSIS — M47816 Spondylosis without myelopathy or radiculopathy, lumbar region: Secondary | ICD-10-CM | POA: Diagnosis not present

## 2021-09-04 DIAGNOSIS — M65311 Trigger thumb, right thumb: Secondary | ICD-10-CM | POA: Diagnosis not present

## 2021-09-11 DIAGNOSIS — R531 Weakness: Secondary | ICD-10-CM | POA: Diagnosis not present

## 2021-09-25 DIAGNOSIS — L258 Unspecified contact dermatitis due to other agents: Secondary | ICD-10-CM | POA: Diagnosis not present

## 2022-02-04 DIAGNOSIS — Z23 Encounter for immunization: Secondary | ICD-10-CM | POA: Diagnosis not present

## 2022-03-06 DIAGNOSIS — E7849 Other hyperlipidemia: Secondary | ICD-10-CM | POA: Diagnosis not present

## 2022-03-06 DIAGNOSIS — Z0001 Encounter for general adult medical examination with abnormal findings: Secondary | ICD-10-CM | POA: Diagnosis not present

## 2022-03-06 DIAGNOSIS — Z1331 Encounter for screening for depression: Secondary | ICD-10-CM | POA: Diagnosis not present

## 2022-03-06 DIAGNOSIS — N529 Male erectile dysfunction, unspecified: Secondary | ICD-10-CM | POA: Diagnosis not present

## 2022-03-06 DIAGNOSIS — Z6829 Body mass index (BMI) 29.0-29.9, adult: Secondary | ICD-10-CM | POA: Diagnosis not present

## 2022-03-06 DIAGNOSIS — E663 Overweight: Secondary | ICD-10-CM | POA: Diagnosis not present

## 2022-03-06 DIAGNOSIS — G4730 Sleep apnea, unspecified: Secondary | ICD-10-CM | POA: Diagnosis not present

## 2022-03-06 DIAGNOSIS — E782 Mixed hyperlipidemia: Secondary | ICD-10-CM | POA: Diagnosis not present

## 2022-04-07 DIAGNOSIS — E0590 Thyrotoxicosis, unspecified without thyrotoxic crisis or storm: Secondary | ICD-10-CM | POA: Diagnosis not present

## 2022-04-14 ENCOUNTER — Ambulatory Visit: Payer: Medicare HMO | Attending: Cardiology | Admitting: Cardiology

## 2022-04-14 ENCOUNTER — Encounter: Payer: Self-pay | Admitting: Cardiology

## 2022-04-14 VITALS — BP 124/64 | HR 67 | Ht 66.0 in | Wt 189.0 lb

## 2022-04-14 DIAGNOSIS — E785 Hyperlipidemia, unspecified: Secondary | ICD-10-CM

## 2022-04-14 DIAGNOSIS — E669 Obesity, unspecified: Secondary | ICD-10-CM

## 2022-04-14 DIAGNOSIS — I493 Ventricular premature depolarization: Secondary | ICD-10-CM | POA: Diagnosis not present

## 2022-04-14 DIAGNOSIS — Z8249 Family history of ischemic heart disease and other diseases of the circulatory system: Secondary | ICD-10-CM | POA: Diagnosis not present

## 2022-04-14 NOTE — Patient Instructions (Addendum)

## 2022-04-14 NOTE — Progress Notes (Signed)
Primary Care Provider: Sharilyn Sites, [place] Cardiologist: Glenetta Hew, MD Electrophysiologist: None  Clinic Note: Chief Complaint  Patient presents with   Follow-up    12 months.    ===================================  ASSESSMENT/PLAN   Problem List Items Addressed This Visit       Cardiology Problems   Frequent PVCs - Primary (Chronic)    Well-controlled at this point.  No significant symptoms.  No indication for beta-blocker as yet.      Relevant Orders   EKG 12-Lead (Completed)   Dyslipidemia, goal LDL below 100 - on statin & fibrate (Chronic)    Remains on initially combination of fenofibrate 160 mg daily along with 40 mg simvastatin.  Tolerating well though with no significant myalgias or arthralgias.  Recent lab check was at goal.        Other   Obesity (BMI 30.0-34.9) (Chronic)    The patient understands the need to lose weight with diet and exercise. We have discussed specific strategies for this.  He is well aware about it difficult to get down to this level, and will now put back on weight.  Recommend continued adjustment and try to get back into more exercise.      Family history of cardiovascular disease (Chronic)    Pretty significant family history of CAD with his brother, but he himself now as well now that age point and has a very low Coronary Calcium Score.  Of course this does not mean that he does not have noncalcified plaque. LDL is pretty much at goal for having had a elevated Coronary Calcium Score. BP is well-controlled.  He takes an aspirin despite the fact that at a low Coronary Calcium Score, the recommendation clear that this is beneficial.  Plan: Okay to continue aspirin but can hold for procedures, surgeries or bleeding/bruising. Continue current dose of simvastatin.  Tolerating without significant myalgias or arthralgias.      ===================================  HPI:    Charles [name] is a 71 y.o. male with  CRFs of HTN, HLD, Obesity & Significant FH of Premature CAD (Father MI in 69s, Brother MI-CAD=CABG '@32'$  & died @ 49)with Low Cor Calcium Scotre ~ 5.5 who presents today for delayed annual follow-up.  Charles [name] was last seen on January 22, 2021-doing well from cardiac standpoint.  Very active and working out and working on his diet.  Trying to lose weight.  Doing lots of chores and projects around the house.  Only dyspneic if he overdoes it.  No chest pain or pressure.  Occasional cramping. No changes made  Recent Hospitalizations: None  Reviewed  CV studies:    The following studies were reviewed today: (if available, images/films reviewed: From Epic Chart or Care Everywhere) None:  Interval History:   Charles [name] presents here today for annual follow-up doing pretty well.  He has gained back some of the weight that he lost.  He cannot get off of his diet and has been eating unhealthy over the last several months.  He also cut down on his manage he was exercising.  As result he gained weight.  Thankfully, he is now try to get back on the right track with his diet.  Really only thing truly no symptom wise is some vertigo type symptoms which is often positional in nature.  Usually with bending over or standing back up against fast.  No associated palpitations or irregular heartbeats.  No syncope or near syncope.  No TIA or  amaurosis fugax.  No active angina or heart failure symptoms.  No signs symptoms of arrhythmia.  CV Review of Symptoms (Summary): Cardiovascular ROS: no chest pain or dyspnea on exertion negative for - edema, irregular heartbeat, orthopnea, palpitations, paroxysmal nocturnal dyspnea, rapid heart rate, shortness of breath, or syncope/ near syncope, TIA/amaurosis fugax, claudication.   REVIEWED OF SYSTEMS   Review of Systems  Constitutional:  Negative for malaise/fatigue (Less active, but not fatigue) and weight loss (Actually gained weight).  HENT:  Negative for  congestion and sinus pain.   Respiratory:  Negative for cough, shortness of breath and wheezing.   Gastrointestinal:  Positive for constipation (Off-and-on constipation.  Currently taking mag citrate). Negative for blood in stool and melena.  Genitourinary:  Negative for hematuria.  Musculoskeletal:  Negative for falls and joint pain.  Neurological:  Negative for dizziness and weakness.  Psychiatric/Behavioral: Negative.  Negative for depression. The patient is not nervous/anxious and does not have insomnia.     I have reviewed and (if needed) personally updated the patient's problem list, medications, allergies, past medical and surgical history, social and family history.   PAST MEDICAL HISTORY   Past Medical History:  Diagnosis Date   Allergy    Asthma    Hyperlipidemia    well controlled   Internal hemorrhoids    PONV (postoperative nausea and vomiting)    Sleep apnea    had surgery to correct - no cpap    Sleep apnea, central 05/31/2009   NOCTURNAL POLYSOMNOGRAM -(DR Constance Holster, ENT) MILD SLEEP APNEA SYNDROME ,MILD HYPOVENTILATION ; ABN SLEEP ARCHITECTURE WITH POOR SLEEP EFFICIENCY AND REDUCED SLOW WAVE SLEEP RESULTS AN INCREASESD STAGE 2 SLEEP. ;; REC 2 L QHS O2 - No longer on CPAP - Had ENT surgery in 2008   Tubular adenoma of colon    Umbilical hernia 40/81/4481   ASYMPTOMATIC WITH NO GI  SYMPTOMS   Ventral hernia 11/30/2012   ASYMPTOMATIC    PAST SURGICAL HISTORY   Past Surgical History:  Procedure Laterality Date   BACK SURGERY     CARPAL TUNNEL RELEASE Bilateral    COLONOSCOPY     x 2- normal    DOPPLER ECHOCARDIOGRAPHY  09/08/2011   EF =>85% ,LV SYSTOLIC FUNCTION NORMAL    FOOT SURGERY Right 1985   HERNIA REPAIR     LUMBAR LAMINECTOMY  1970   NASAL SEPTUM SURGERY  1970   SHOULDER SURGERY Bilateral    sleep apnea surgery     removal of soft palate, septoplasty #2, tonsillectomy    TONSILLECTOMY  10/11/2008   DR Constance Holster-- with uvulopalatophrayngoplasty,revision  nasal septolasty   TONSILLECTOMY     TREADMILL EXERCISE STRESS TEST  01/01/2012; 07/2013   a) 9/'13:NORMOTENSIVE REPONSE TO EXERCISE; GOOD EXERCISE TOLERANCE;NO ECG evidence of exercise-induced ischemia; rare PVCs;; b) 4/'15: Non-ischemic, EF 63%   UMBILICAL HERNIA REPAIR      Immunization History  Administered Date(s) Administered   Influenza-Unspecified 01/26/2013    MEDICATIONS/ALLERGIES   Current Meds  Medication Sig   Ascorbic Acid (VITAMIN C PO) Take 1,000 mg by mouth.   aspirin EC 81 MG tablet Take 81 mg by mouth daily.   BIOTIN PO Take 1 tablet by mouth as needed.   Calcium Carbonate-Vit D-Min (CALCIUM 1200 PO) Take by mouth.   Cholecalciferol (VITAMIN D3) 400 UNITS CAPS Take by mouth.   Coenzyme Q10 (CO Q 10 PO) Take 30 mg by mouth daily.   Cyanocobalamin (VITAMIN B 12 PO) Take 1,000 mg by mouth daily.  fenofibrate 160 MG tablet Take 160 mg by mouth daily.   folic acid (FOLVITE) 1 MG tablet Take 1 mg by mouth daily.   Glucosamine HCl (GLUCOSAMINE PO) Take by mouth.   ibuprofen (ADVIL,MOTRIN) 200 MG tablet Take 200 mg by mouth every 6 (six) hours as needed.   Magnesium 200 MG TABS Take by mouth.   Melatonin 5 MG TABS Take 5 mg by mouth daily.   meloxicam (MOBIC) 15 MG tablet Take 15 mg by mouth as needed.    Multiple Vitamin (MULTIVITAMIN) tablet Take 1 tablet by mouth daily.   Omega-3 Fatty Acids (FISH OIL PO) Take by mouth. Take 2 caps in morning and 1 caps in evening   Psyllium (KONSYL DAILY FIBER PO) Take by mouth 2 (two) times daily after a meal.   sildenafil (REVATIO) 20 MG tablet Take 20 mg by mouth as needed.   simvastatin (ZOCOR) 20 MG tablet Take 20 mg by mouth daily.   tamsulosin (FLOMAX) 0.4 MG CAPS capsule Take 0.4 mg by mouth daily after supper.   TURMERIC PO Take 1 tablet by mouth.    Allergies  Allergen Reactions   Penicillins Rash    "many, many, many years ago"    SOCIAL HISTORY/FAMILY HISTORY   Reviewed in Epic:  Pertinent findings:  Social  History   Tobacco Use   Smoking status: Never   Smokeless tobacco: Never  Vaping Use   Vaping Use: Never used  Substance Use Topics   Alcohol use: Yes    Comment: occasionally    Drug use: No   Social History   Social History Narrative   Married father of 2 with 1 grandchild. 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   He does not do any regular walking.  He is to use his home workout machine, but now is noted that he really hasn't had the same amount of time at the end of the day the use to. Using working from 6 AM to 7 PM.    OBJCTIVE -PE, EKG, labs   Wt Readings from Last 3 Encounters:  04/14/22 189 lb (85.7 kg)  08/20/21 183 lb 6 oz (83.2 kg)  06/14/21 175 lb (79.4 kg)    Physical Exam: BP 124/64 (BP Location: Left Arm, Patient Position: Sitting, Cuff Size: Normal)   Pulse 67   Ht '5\' 6"'$  (1.676 m)   Wt 189 lb (85.7 kg)   BMI 30.51 kg/m  Physical Exam Vitals reviewed.  Constitutional:      General: He is not in acute distress.    Appearance: Normal appearance. He is obese. He is not ill-appearing or toxic-appearing.     Comments: borderline obese.  Had lost all the way down to 175 pounds, now up to 289.  HENT:     Head: Normocephalic and atraumatic.  Neck:     Vascular: No carotid bruit or JVD.  Cardiovascular:     Rate and Rhythm: Normal rate and regular rhythm. No extrasystoles are present.    Chest Wall: PMI is not displaced.     Pulses: Normal pulses.     Heart sounds: Normal heart sounds, S1 normal and S2 normal. No murmur heard.    No friction rub. No gallop.  Pulmonary:  Effort: Pulmonary effort is normal. No respiratory distress.     Breath sounds: Normal breath sounds. No wheezing, rhonchi or rales.  Chest:     Chest wall: No tenderness.  Musculoskeletal:        General:  No swelling. Normal range of motion.     Cervical back: Normal range of motion and neck supple.  Skin:    General: Skin is warm and dry.  Neurological:     General: No focal deficit present.     Mental Status: He is alert and oriented to person, place, and time.     Gait: Gait normal.  Psychiatric:        Mood and Affect: Mood normal.        Behavior: Behavior normal.        Thought Content: Thought content normal.        Judgment: Judgment normal.     Adult ECG Report  Rate: 67;  Rhythm: normal sinus rhythm and normal axis, intervals and durations. ;   Narrative Interpretation: Normal  Recent Labs:   03/09/2022: TC 133, TG 100, HDL 45, LDL 69.  Hgb 13.9, Cr 1.1, K+ 4.7 04/08/2022: TSH 0.798.  Lab Results  Component Value Date   CREATININE 0.89 08/20/2021   BUN 14 08/20/2021   NA 138 08/20/2021   K 4.3 08/20/2021   CL 103 08/20/2021   CO2 27 08/20/2021      Latest Ref Rng & Units 08/20/2021    9:28 AM 11/30/2012   11:50 AM 10/06/2008    8:54 AM  CBC  WBC 4.0 - 10.5 K/uL 3.9  5.2  4.3   Hemoglobin 13.0 - 17.0 g/dL 15.6  15.4  14.8   Hematocrit 39.0 - 52.0 % 46.7  44.6  43.5   Platelets 150.0 - 400.0 K/uL 195.0  225  198     No results found for: "HGBA1C" Lab Results  Component Value Date   TSH 0.76 08/20/2021    ================================================== I spent a total of 21 minutes with the patient spent in direct patient consultation.  Additional time spent with chart review  / charting (studies, outside notes, etc): 12 min Total Time: 33 min  Current medicines are reviewed at length with the patient today.  (+/- concerns) n/a  Notice: This dictation was prepared with Dragon dictation along with smart phrase technology. Any transcriptional errors that result from this process are unintentional and may not be corrected upon review.  Studies Ordered:   Orders Placed This Encounter  Procedures   EKG 12-Lead   No orders of the defined types were  placed in this encounter.   Patient Instructions / Medication Changes & Studies & Tests Ordered   Patient Instructions  Medication Instructions:   NO CHANGES  *If you need a refill on your cardiac medications before your next appointment, please call your pharmacy*   Lab Work:  NOT NEEDED   Testing/Procedures:  NOT NEEDED  Follow-Up: At [facility], you and your health needs are our priority.  As part of our continuing mission to provide you with exceptional heart care, we have created designated Provider Care Teams.  These Care Teams include your primary Cardiologist (physician) and Advanced Practice Providers (APPs -  Physician Assistants and Nurse Practitioners) who all work together to provide you with the care you need, when you need it.     Your next appointment:   12 month(s)  The format for your next appointment:   In Person  Provider:  Glenetta Hew, MD       Leonie Man, MD, MS Glenetta Hew, M.D., M.S. Interventional Cardiologist  [place]  Pager # [phone] Phone # [phone] [address] [place], [place] [address]   Thank you for choosing [facility] [place]!!

## 2022-05-01 ENCOUNTER — Encounter: Payer: Self-pay | Admitting: Cardiology

## 2022-05-01 NOTE — Assessment & Plan Note (Signed)
Remains on initially combination of fenofibrate 160 mg daily along with 40 mg simvastatin.  Tolerating well though with no significant myalgias or arthralgias.  Recent lab check was at goal.

## 2022-05-01 NOTE — Assessment & Plan Note (Signed)
The patient understands the need to lose weight with diet and exercise. We have discussed specific strategies for this.  He is well aware about it difficult to get down to this level, and will now put back on weight.  Recommend continued adjustment and try to get back into more exercise.

## 2022-05-01 NOTE — Assessment & Plan Note (Signed)
Pretty significant family history of CAD with his brother, but he himself now as well now that age point and has a very low Coronary Calcium Score.  Of course this does not mean that he does not have noncalcified plaque. LDL is pretty much at goal for having had a elevated Coronary Calcium Score. BP is well-controlled.  He takes an aspirin despite the fact that at a low Coronary Calcium Score, the recommendation clear that this is beneficial.  Plan: Okay to continue aspirin but can hold for procedures, surgeries or bleeding/bruising. Continue current dose of simvastatin.  Tolerating without significant myalgias or arthralgias.

## 2022-05-01 NOTE — Assessment & Plan Note (Signed)
Well-controlled at this point.  No significant symptoms.  No indication for beta-blocker as yet.

## 2022-07-09 DIAGNOSIS — E0590 Thyrotoxicosis, unspecified without thyrotoxic crisis or storm: Secondary | ICD-10-CM | POA: Diagnosis not present

## 2022-09-17 DIAGNOSIS — H524 Presbyopia: Secondary | ICD-10-CM | POA: Diagnosis not present

## 2022-09-17 DIAGNOSIS — H2513 Age-related nuclear cataract, bilateral: Secondary | ICD-10-CM | POA: Diagnosis not present

## 2022-09-17 DIAGNOSIS — H5213 Myopia, bilateral: Secondary | ICD-10-CM | POA: Diagnosis not present

## 2022-09-17 DIAGNOSIS — Z0100 Encounter for examination of eyes and vision without abnormal findings: Secondary | ICD-10-CM | POA: Diagnosis not present

## 2023-01-28 DIAGNOSIS — Z23 Encounter for immunization: Secondary | ICD-10-CM | POA: Diagnosis not present

## 2023-02-16 DIAGNOSIS — M25561 Pain in right knee: Secondary | ICD-10-CM | POA: Diagnosis not present

## 2023-02-16 DIAGNOSIS — M25662 Stiffness of left knee, not elsewhere classified: Secondary | ICD-10-CM | POA: Diagnosis not present

## 2023-02-16 DIAGNOSIS — M65331 Trigger finger, right middle finger: Secondary | ICD-10-CM | POA: Diagnosis not present

## 2023-02-16 DIAGNOSIS — M79641 Pain in right hand: Secondary | ICD-10-CM | POA: Diagnosis not present

## 2023-02-16 DIAGNOSIS — M65352 Trigger finger, left little finger: Secondary | ICD-10-CM | POA: Diagnosis not present

## 2023-02-16 DIAGNOSIS — M25562 Pain in left knee: Secondary | ICD-10-CM | POA: Diagnosis not present

## 2023-02-16 DIAGNOSIS — M25661 Stiffness of right knee, not elsewhere classified: Secondary | ICD-10-CM | POA: Diagnosis not present

## 2023-02-16 DIAGNOSIS — M79642 Pain in left hand: Secondary | ICD-10-CM | POA: Diagnosis not present

## 2023-02-16 DIAGNOSIS — M170 Bilateral primary osteoarthritis of knee: Secondary | ICD-10-CM | POA: Diagnosis not present

## 2023-02-25 DIAGNOSIS — R011 Cardiac murmur, unspecified: Secondary | ICD-10-CM | POA: Diagnosis not present

## 2023-02-25 DIAGNOSIS — N401 Enlarged prostate with lower urinary tract symptoms: Secondary | ICD-10-CM | POA: Diagnosis not present

## 2023-02-25 DIAGNOSIS — E6609 Other obesity due to excess calories: Secondary | ICD-10-CM | POA: Diagnosis not present

## 2023-02-25 DIAGNOSIS — Z6830 Body mass index (BMI) 30.0-30.9, adult: Secondary | ICD-10-CM | POA: Diagnosis not present

## 2023-02-25 DIAGNOSIS — Z9229 Personal history of other drug therapy: Secondary | ICD-10-CM | POA: Diagnosis not present

## 2023-02-25 DIAGNOSIS — E0590 Thyrotoxicosis, unspecified without thyrotoxic crisis or storm: Secondary | ICD-10-CM | POA: Diagnosis not present

## 2023-02-26 DIAGNOSIS — Z9229 Personal history of other drug therapy: Secondary | ICD-10-CM | POA: Diagnosis not present

## 2023-02-26 DIAGNOSIS — Z0001 Encounter for general adult medical examination with abnormal findings: Secondary | ICD-10-CM | POA: Diagnosis not present

## 2023-02-26 DIAGNOSIS — N401 Enlarged prostate with lower urinary tract symptoms: Secondary | ICD-10-CM | POA: Diagnosis not present

## 2023-03-28 DIAGNOSIS — F419 Anxiety disorder, unspecified: Secondary | ICD-10-CM | POA: Diagnosis not present

## 2023-03-28 DIAGNOSIS — E782 Mixed hyperlipidemia: Secondary | ICD-10-CM | POA: Diagnosis not present

## 2023-03-28 DIAGNOSIS — J302 Other seasonal allergic rhinitis: Secondary | ICD-10-CM | POA: Diagnosis not present

## 2023-04-03 DIAGNOSIS — E782 Mixed hyperlipidemia: Secondary | ICD-10-CM | POA: Diagnosis not present

## 2023-04-03 DIAGNOSIS — Z0001 Encounter for general adult medical examination with abnormal findings: Secondary | ICD-10-CM | POA: Diagnosis not present

## 2023-04-03 DIAGNOSIS — Z1331 Encounter for screening for depression: Secondary | ICD-10-CM | POA: Diagnosis not present

## 2023-04-03 DIAGNOSIS — G4730 Sleep apnea, unspecified: Secondary | ICD-10-CM | POA: Diagnosis not present

## 2023-04-03 DIAGNOSIS — Z6830 Body mass index (BMI) 30.0-30.9, adult: Secondary | ICD-10-CM | POA: Diagnosis not present

## 2023-04-03 DIAGNOSIS — E6609 Other obesity due to excess calories: Secondary | ICD-10-CM | POA: Diagnosis not present

## 2023-04-28 DIAGNOSIS — E782 Mixed hyperlipidemia: Secondary | ICD-10-CM | POA: Diagnosis not present

## 2023-04-28 DIAGNOSIS — F419 Anxiety disorder, unspecified: Secondary | ICD-10-CM | POA: Diagnosis not present

## 2023-05-06 ENCOUNTER — Ambulatory Visit: Payer: Medicare HMO | Attending: Cardiology | Admitting: Cardiology

## 2023-05-06 VITALS — BP 120/66 | HR 61 | Ht 67.0 in | Wt 193.0 lb

## 2023-05-06 DIAGNOSIS — Z8249 Family history of ischemic heart disease and other diseases of the circulatory system: Secondary | ICD-10-CM

## 2023-05-06 DIAGNOSIS — E785 Hyperlipidemia, unspecified: Secondary | ICD-10-CM | POA: Diagnosis not present

## 2023-05-06 DIAGNOSIS — I493 Ventricular premature depolarization: Secondary | ICD-10-CM

## 2023-05-06 DIAGNOSIS — E66811 Obesity, class 1: Secondary | ICD-10-CM | POA: Diagnosis not present

## 2023-05-06 NOTE — Progress Notes (Signed)
 Cardiology Office Note:  .   Date:  05/10/2023  ID:  Charles [name], DOB May 22, 1950, MRN [id number] PCP: Marvine Rush, MD  [facility] Providers Cardiologist:  Alm Clay, MD     Chief Complaint  Patient presents with   Follow-up    Family history of CAD, but no symptoms.    Patient Profile: .     Charles [name] is an ~obese  73 y.o. male with a PMH noted below who presents here for annual f/u at the request of Marvine Rush, MD.  CRFs: HTN, HLD, Obesity & Significant FH of Premature CAD (Father MI in 45s, Brother MI-CAD=CABG @32  & died @ 69) =>  Low Cor Calcium Score ~ 5.5      Charles [name] was last seen in December 2023-doing well.  No major changes.  Subjective  Discussed the use of AI scribe software for clinical note transcription with the patient, who gave verbal consent to proceed.  History of Present Illness   The patient, a 73 year old with a history of cardiac risk factors, presents for a routine follow-up. He reports no recent issues with palpitations, chest pain, or dizziness. He does mention occasional vertigo but denies any associated syncope. He also denies any dyspnea, orthopnea/PND, or lower extremity edema.  The patient remains physically active, engaging in regular walking and yard work. He reports no exercise-induced chest pain or dyspnea. He does note occasional leg pain, attributed to knee osteoarthritis confirmed by orthopedic consultation and imaging.  The patient also mentions occasional muscle spasms in the lower extremities, which he manages with magnesium supplementation. These spasms are infrequent, with the last episode occurring approximately six months ago.  The patient has not had any recent illnesses, hospitalizations, or significant changes in health. He has noticed some weight gain, which he attributes to the holiday season and plans to address with dietary modifications.  In terms of cardiovascular risk management, the patient is  on simvastatin, CoQ10, and fish oil. Recent labs showed a slight increase in LDL cholesterol, which will be monitored closely. The patient's blood pressure remains well-controlled.      Cardiovascular ROS: no chest pain or dyspnea on exertion positive for - vertigo-like dizziness; weight gain negative for - edema, irregular heartbeat, orthopnea, palpitations, paroxysmal nocturnal dyspnea, rapid heart rate, shortness of breath, or syncope or near syncope, TIA or emesis BX, claudication  ROS:  Review of Systems - Negative except Sx noted in HPI. - Vertigo; Knee OA pain -- but controlled; occasional thigh cramping / spasms; - may be more related to chronic back pain.     Objective   Medications - Magnesium 200 mg daily - Simvastatin 20 mg daily - CoQ10 300 mg daily - - Fish oil 2 caps AM & 1 cap PM  - PRN sildenafil 20 mg (ED)  Studies Reviewed: SABRA   EKG Interpretation Date/Time:  Wednesday May 06 2023 09:55:07 EST Ventricular Rate:  61 PR Interval:  142 QRS Duration:  96 QT Interval:  408 QTC Calculation: 410 R Axis:   -10  Text Interpretation: Normal sinus rhythm Normal ECG When compared with ECG of 11-Oct-2008 08:21, No significant change was found Confirmed by Clay Alm ([id number]) on 05/06/2023 10:20:23 AM    No new studies   LABS Total cholesterol: 149 (02/27/2023) Triglycerides: 97 (02/27/2023) HDL: 53 (02/27/2023) LDL: 78 (02/27/2023) Hemoglobin: 14.6 (02/27/2023) Creatinine: 1.12 (02/27/2023) Potassium: 4.9 (02/27/2023) TSH: 0.745 (02/27/2023)  Risk Assessment/Calculations:  Physical Exam:   VS:  BP 120/66 (BP Location: Right Arm, Patient Position: Sitting, Cuff Size: Normal)   Pulse 61   Ht 5' 7 (1.702 m)   Wt 193 lb (87.5 kg)   SpO2 95%   BMI 30.23 kg/m    Wt Readings from Last 3 Encounters:  05/06/23 193 lb (87.5 kg)  04/14/22 189 lb (85.7 kg)  08/20/21 183 lb 6 oz (83.2 kg)    GEN: Well nourished, well groomed; in no acute distress;  mildly obese healthy appearing; notable weight gain NECK: No JVD; No carotid bruits CARDIAC: Normal S1, S2; RRR, no murmurs, rubs, gallops RESPIRATORY:  Clear to auscultation without rales, wheezing or rhonchi ; nonlabored, good air movement. ABDOMEN: Soft, non-tender, non-distended EXTREMITIES:  No edema; No deformity     ASSESSMENT AND PLAN: .    Problem List Items Addressed This Visit       Cardiology Problems   Dyslipidemia, goal LDL below 100 - on statin & fibrate (Chronic)   Most recent LDL 78, relatively reassuring for low Coronary Calcium Score Tolerating current dose of simvastatin without myalgias or arthralgias. -Continue simvastatin 20 mg daily along with fish oil co-Q10.      Relevant Orders   EKG 12-Lead (Completed)   Frequent PVCs (Chronic)   No major issues.  He feels about a few there but nothing worrisome to him.  Monitor for recurrence, for now we will hold off on beta-blocker.        Other   Family history of cardiovascular disease - Primary (Chronic)   No symptoms of palpitations, chest pain, shortness of breath, or dizziness. EKG normal. LDL slightly elevated from 69 to 78, but within acceptable range given low coronary calcium score and absence of symptoms. -Continue current regimen of simvastatin and CoQ10. -Encourage maintenance of physical activity and healthy diet. -Consider switching to a more potent cholesterol medication if LDL continues to rise.      Obesity (BMI 30.0-34.9) (Chronic)   Weight Management Recent weight gain, but overall improvement from initial weight. -Encourage continued focus on diet and exercise for weight management.        Musculoskeletal Health Occasional leg spasms, managed with magnesium supplementation and positional adjustments during sleep. No significant pain or discomfort. -Continue current management strategies.   Follow-up in approximately one year, or sooner if any new symptoms arise. Return in about 1 year  (around 05/05/2024) for Routine follow up with me.     Signed, Alm MICAEL Clay, MD, MS Alm Clay, M.D., M.S. Interventional Cardiologist  [facility]  Pager # [phone] Phone # [phone] [address] [place], [place] [address]

## 2023-05-06 NOTE — Patient Instructions (Addendum)
 Medication Instructions:   No changes  *If you need a refill on your cardiac medications before your next appointment, please call your pharmacy*   Lab Work: Not needed   Testing/Procedures:  Not needed  Follow-Up: At [facility], you and your health needs are our priority.  As part of our continuing mission to provide you with exceptional heart care, we have created designated Provider Care Teams.  These Care Teams include your primary Cardiologist (physician) and Advanced Practice Providers (APPs -  Physician Assistants and Nurse Practitioners) who all work together to provide you with the care you need, when you need it.     Your next appointment:   12 month(s)  The format for your next appointment:   In Person  Provider:   Alm Clay, MD  or APP  Other Your physician discussed the importance of regular exercise and diet  recommended that you start or continue a regular exercise program for good health.

## 2023-05-10 ENCOUNTER — Encounter: Payer: Self-pay | Admitting: Cardiology

## 2023-05-10 NOTE — Assessment & Plan Note (Signed)
 No symptoms of palpitations, chest pain, shortness of breath, or dizziness. EKG normal. LDL slightly elevated from 69 to 78, but within acceptable range given low coronary calcium score and absence of symptoms. -Continue current regimen of simvastatin and CoQ10. -Encourage maintenance of physical activity and healthy diet. -Consider switching to a more potent cholesterol medication if LDL continues to rise.

## 2023-05-10 NOTE — Assessment & Plan Note (Signed)
 No major issues.  He feels about a few there but nothing worrisome to him.  Monitor for recurrence, for now we will hold off on beta-blocker.

## 2023-05-10 NOTE — Assessment & Plan Note (Signed)
 Most recent LDL 78, relatively reassuring for low Coronary Calcium Score Tolerating current dose of simvastatin without myalgias or arthralgias. -Continue simvastatin 20 mg daily along with fish oil co-Q10.

## 2023-05-10 NOTE — Assessment & Plan Note (Signed)
 Weight Management Recent weight gain, but overall improvement from initial weight. -Encourage continued focus on diet and exercise for weight management.

## 2023-10-22 DIAGNOSIS — E782 Mixed hyperlipidemia: Secondary | ICD-10-CM | POA: Diagnosis not present

## 2023-10-22 DIAGNOSIS — E6609 Other obesity due to excess calories: Secondary | ICD-10-CM | POA: Diagnosis not present

## 2023-10-22 DIAGNOSIS — Z6831 Body mass index (BMI) 31.0-31.9, adult: Secondary | ICD-10-CM | POA: Diagnosis not present

## 2023-10-22 DIAGNOSIS — N529 Male erectile dysfunction, unspecified: Secondary | ICD-10-CM | POA: Diagnosis not present

## 2023-10-22 DIAGNOSIS — R7309 Other abnormal glucose: Secondary | ICD-10-CM | POA: Diagnosis not present

## 2023-10-22 DIAGNOSIS — E0590 Thyrotoxicosis, unspecified without thyrotoxic crisis or storm: Secondary | ICD-10-CM | POA: Diagnosis not present

## 2023-10-22 DIAGNOSIS — G2581 Restless legs syndrome: Secondary | ICD-10-CM | POA: Diagnosis not present

## 2023-10-22 DIAGNOSIS — N401 Enlarged prostate with lower urinary tract symptoms: Secondary | ICD-10-CM | POA: Diagnosis not present

## 2023-10-22 DIAGNOSIS — F419 Anxiety disorder, unspecified: Secondary | ICD-10-CM | POA: Diagnosis not present

## 2023-10-22 DIAGNOSIS — G4730 Sleep apnea, unspecified: Secondary | ICD-10-CM | POA: Diagnosis not present

## 2023-10-22 DIAGNOSIS — E7849 Other hyperlipidemia: Secondary | ICD-10-CM | POA: Diagnosis not present

## 2024-04-15 ENCOUNTER — Encounter: Payer: Self-pay | Admitting: Cardiology

## 2024-04-21 ENCOUNTER — Other Ambulatory Visit: Payer: Self-pay | Admitting: Medical Genetics

## 2024-05-04 ENCOUNTER — Other Ambulatory Visit (HOSPITAL_COMMUNITY)
Admission: RE | Admit: 2024-05-04 | Discharge: 2024-05-04 | Disposition: A | Discharge status: Home / Self Care | Payer: Self-pay | Source: Ambulatory Visit | Attending: Oncology | Admitting: Oncology

## 2024-05-09 ENCOUNTER — Ambulatory Visit: Attending: Cardiology | Admitting: Cardiology

## 2024-05-09 VITALS — BP 118/60 | HR 89 | Ht 68.0 in | Wt 196.0 lb

## 2024-05-09 DIAGNOSIS — Z8249 Family history of ischemic heart disease and other diseases of the circulatory system: Secondary | ICD-10-CM | POA: Diagnosis not present

## 2024-05-09 DIAGNOSIS — E66811 Obesity, class 1: Secondary | ICD-10-CM

## 2024-05-09 DIAGNOSIS — E785 Hyperlipidemia, unspecified: Secondary | ICD-10-CM | POA: Diagnosis not present

## 2024-05-09 DIAGNOSIS — Z6829 Body mass index (BMI) 29.0-29.9, adult: Secondary | ICD-10-CM

## 2024-05-09 DIAGNOSIS — I493 Ventricular premature depolarization: Secondary | ICD-10-CM | POA: Diagnosis not present

## 2024-05-09 NOTE — Patient Instructions (Signed)
 Medication Instructions:   No changes  *If you need a refill on your cardiac medications before your next appointment, please call your pharmacy*   Lab Work: Not needed    Testing/Procedures:  Not needed  Follow-Up: At [facility], you and your health needs are our priority.  As part of our continuing mission to provide you with exceptional heart care, we have created designated Provider Care Teams.  These Care Teams include your primary Cardiologist (physician) and Advanced Practice Providers (APPs -  Physician Assistants and Nurse Practitioners) who all work together to provide you with the care you need, when you need it.     Your next appointment:   12 month(s)  The format for your next appointment:   In Person  Provider:   Alm Clay, MD or APP

## 2024-05-09 NOTE — Progress Notes (Signed)
 " Cardiology Office Note:  .   Date:  05/15/2024  ID:  Charles [name], DOB [birth year], MRN [id number] PCP: Marvine Rush, MD   HeartCare Providers Cardiologist:  Alm Clay, MD     Chief Complaint  Patient presents with   Follow-up    Annual follow-up.  Doing well.    Patient Profile: .     Charles [name] is a ~  borderline obese 74 y.o. male with a PMH notable for CRFs (HTN, HLD and obesity) along with significant family history of premature CAD, with a Low Coronary Calcium Score who presents here for annual follow-up at the request of Marvine Rush, MD.  CRFs: HTN, HLD, Obesity  Significant FH of Premature CAD (Father MI in late 54s - died in 40s, Brother MI-CAD=CABG @37  & died @ 36) =>  Low Cor Calcium Score ~ 5.5   Charles [name] was last seen on May 06, 2023.-Doing well with no major issues.  Cardiovascular review of symptoms negative with exception of vertigo like dizziness.    Subjective  Discussed the use of AI scribe software for clinical note transcription with the patient, who gave verbal consent to proceed. History of Present Illness Charles [name] is a 74 year old male who presents for a follow-up visit regarding premature ventricular contractions (PVCs).  He has not experienced any noticeable heart racing, skipping, or flip-flopping sensations. No awareness of premature ventricular contractions. He is not on any blood pressure medications.  No shortness of breath, chest pain, pressure, or tightness under normal circumstances. He may become slightly out of breath with exertion, such as walking quickly uphill on his property. No shortness of breath when lying flat or waking up at night.  He experiences occasional vertigo, particularly when moving in certain positions, described as 'a little bout, little position of the out of bounds, kind of.' This has been a long-standing issue and does not significantly bother him.  He is currently taking 81 mg of  aspirin at night, fenofibrate, simvastatin every night, CoQ10 twice a day, and fish oil twice a day.  His brother died of a heart attack at 22 and had bypass surgery at 8. His father had cardiovascular disease and underwent a triple bypass at 37 or 74. His brother was diabetic.  He remains active, engaging in physical activities such as grinding stumps on his property and aims to maintain around 6,000 steps a day.    Objective   Medications - Aspirin 81 mg daily-  - Simvastatin 20 mg daily;- Fenofibrate 160 mg; - CoQ10 300 mg daily -; - Fish oil 2 caps AM & 1 cap PM  - PRN sildenafil 20 mg (ED) - Magnesium 200 mg daily for PVCs  Studies Reviewed: SABRA   EKG Interpretation Date/Time:  Monday May 09 2024 11:56:23 EST Ventricular Rate:  89 PR Interval:  154 QRS Duration:  96 QT Interval:  364 QTC Calculation: 442 R Axis:   -9  Text Interpretation: Sinus rhythm with sinus arrhythmia with frequent Premature ventricular complexes When compared with ECG of 06-May-2023 09:55, Premature ventricular complexes are now Present Confirmed by Clay Alm ([id number]) on 05/09/2024 12:59:46 PM    Lab Results  Component Value Date   CHOL 113 (L) 08/14/2015   HDL 41 08/14/2015   LDLCALC 48 08/14/2015   TRIG 122 08/14/2015   CHOLHDL 2.8 08/14/2015   No results found for: HGBA1C Lab Results  Component Value Date   NA 138 08/20/2021  K 4.3 08/20/2021   CREATININE 0.89 08/20/2021   GFR 87.58 08/20/2021   GLUCOSE 79 08/20/2021    Results Labs Lipid panel (03/2024): Cholesterol slightly increased compared to prior, overall within acceptable limits  Diagnostic Coronary Calcium Score CT 02/22/2019: CAC 5.5.  Scattered aortic atherosclerosis  Risk Assessment/Calculations:         Physical Exam:   VS:  BP 118/60 (BP Location: Right Arm, Patient Position: Sitting, Cuff Size: Normal)   Pulse 89   Ht 5' 8 (1.727 m)   Wt 196 lb (88.9 kg)   SpO2 95%   BMI 29.80 kg/m    Wt Readings  from Last 3 Encounters:  05/09/24 196 lb (88.9 kg)  05/06/23 193 lb (87.5 kg)  04/14/22 189 lb (85.7 kg)    GEN: Well nourished, well groomed; in no acute distress; mildly obese but otherwise healthy-appearing NECK: No JVD; No carotid bruits CARDIAC: Normal S1, S2; RRR, no murmurs, rubs, gallops RESPIRATORY:  Clear to auscultation without rales, wheezing or rhonchi ; nonlabored, good air movement. ABDOMEN: Soft, non-tender, non-distended EXTREMITIES:  No edema; No deformity      ASSESSMENT AND PLAN: .   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 No impact on cardiac function or quality of life. No indication for beta blocker therapy. - Monitor for new symptoms: palpitations, dizziness, syncope. - Educated on PVCs and symptoms to monitor.  Hold off on treatment with beta-blocker for now  Dyslipidemia, goal LDL below 100 - on statin & fibrate Well-managed with current medication. Recent labs showed good cholesterol levels within acceptable range. - Continue simvastatin 20 mg daily, fenofibrate 160 mg daily, CoQ10, fish oil. - Monitor lipid levels periodically per PCP  Family history of cardiovascular disease Despite very reassuring CAC 5 years ago, continue to treat CRF's with targeting LDL <100, well within goal. He has opted to take 81 mg aspirin daily. BP well-controlled with no active symptoms.    - Continue current lipid management.  - Continue to remain active with exercise and diet modification.  Obesity (BMI 30.0-34.9) Discussed healthy diet and increased exercise.   Orders Placed This Encounter  Procedures   EKG 12-Lead       Follow-Up: No follow-ups on file.     Signed, Alm MICAEL Clay, MD, MS Alm Clay, M.D., M.S. Interventional Cardiologist  [facility] Pager # [phone]      "

## 2024-05-10 LAB — GENECONNECT MOLECULAR SCREEN: Genetic Analysis Overall Interpretation: NEGATIVE

## 2024-05-15 ENCOUNTER — Encounter: Payer: Self-pay | Admitting: Cardiology

## 2024-05-15 NOTE — Assessment & Plan Note (Signed)
 Discussed healthy diet and increased exercise.

## 2024-05-15 NOTE — Assessment & Plan Note (Signed)
 Well-managed with current medication. Recent labs showed good cholesterol levels within acceptable range. - Continue simvastatin 20 mg daily, fenofibrate 160 mg daily, CoQ10, fish oil. - Monitor lipid levels periodically per PCP

## 2024-05-15 NOTE — Assessment & Plan Note (Signed)
 Despite very reassuring CAC 5 years ago, continue to treat CRF's with targeting LDL <100, well within goal. He has opted to take 81 mg aspirin daily. BP well-controlled with no active symptoms.    - Continue current lipid management.  - Continue to remain active with exercise and diet modification.

## 2024-05-15 NOTE — Assessment & Plan Note (Signed)
 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 No impact on cardiac function or quality of life. No indication for beta blocker therapy. - Monitor for new symptoms: palpitations, dizziness, syncope. - Educated on PVCs and symptoms to monitor.  Hold off on treatment with beta-blocker for now
